# Patient Record
Sex: Male | Born: 1970 | Race: White | Hispanic: No | Marital: Married | Smoking: Former smoker
Health system: Southern US, Community
[De-identification: ages and names within clinical notes are randomized; demographics above are authoritative.]

## PROBLEM LIST (undated history)

## (undated) DIAGNOSIS — N433 Hydrocele, unspecified: Secondary | ICD-10-CM

## (undated) DIAGNOSIS — E785 Hyperlipidemia, unspecified: Secondary | ICD-10-CM

## (undated) DIAGNOSIS — I10 Essential (primary) hypertension: Secondary | ICD-10-CM

## (undated) HISTORY — PX: APPENDECTOMY: SHX54

## (undated) HISTORY — DX: Hyperlipidemia, unspecified: E78.5

---

## 2016-01-26 ENCOUNTER — Emergency Department (HOSPITAL_BASED_OUTPATIENT_CLINIC_OR_DEPARTMENT_OTHER)
Admission: EM | Admit: 2016-01-26 | Discharge: 2016-01-26 | Disposition: A | Discharge status: Home / Self Care | Payer: Self-pay | Attending: Emergency Medicine | Admitting: Emergency Medicine

## 2016-01-26 ENCOUNTER — Emergency Department (HOSPITAL_BASED_OUTPATIENT_CLINIC_OR_DEPARTMENT_OTHER): Payer: Self-pay

## 2016-01-26 ENCOUNTER — Encounter (HOSPITAL_BASED_OUTPATIENT_CLINIC_OR_DEPARTMENT_OTHER): Payer: Self-pay | Admitting: *Deleted

## 2016-01-26 DIAGNOSIS — M25512 Pain in left shoulder: Secondary | ICD-10-CM | POA: Insufficient documentation

## 2016-01-26 DIAGNOSIS — R030 Elevated blood-pressure reading, without diagnosis of hypertension: Secondary | ICD-10-CM

## 2016-01-26 DIAGNOSIS — I10 Essential (primary) hypertension: Secondary | ICD-10-CM | POA: Insufficient documentation

## 2016-01-26 DIAGNOSIS — Z880 Allergy status to penicillin: Secondary | ICD-10-CM | POA: Insufficient documentation

## 2016-01-26 DIAGNOSIS — IMO0001 Reserved for inherently not codable concepts without codable children: Secondary | ICD-10-CM

## 2016-01-26 MED ORDER — MELOXICAM 7.5 MG PO TABS: 7.5000 mg | ORAL_TABLET | Freq: Every day | ORAL | Status: DC

## 2016-01-26 NOTE — ED Provider Notes (Signed)
CSN: [id number]     Arrival date & time 01/26/16  1113 History   First MD Initiated Contact with Patient 01/26/16 1123     Chief Complaint  Patient presents with  . Shoulder Pain     (Consider location/radiation/quality/duration/timing/severity/associated sxs/prior Treatment) Patient is a 45 y.o. male presenting with shoulder pain. The history is provided by the patient and medical records. No language interpreter was used.  Shoulder Pain Associated symptoms: no fever and no neck pain    Christian Simon is a 45 y.o. male  with no pertinent PMH who presents to the Emergency Department complaining of constant, persistent left shoulder pain 1 week. 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 He denies any injury or trauma, however states the repetitive motion of his job could be source of pain. No medications taken prior to arrival for symptoms. Pain is worse with palpation and movement. No alleviating factors noted.  History reviewed. No pertinent past medical history. Past Surgical History  Procedure Laterality Date  . Appendectomy     History reviewed. No pertinent family history. Social History  Substance Use Topics  . Smoking status: Never Smoker   . Smokeless tobacco: None  . Alcohol Use: No    Review of Systems  Constitutional: Negative for fever.  HENT: Negative for congestion.   Eyes: Negative for visual disturbance.  Respiratory: Negative for shortness of breath.   Cardiovascular: Negative for chest pain.  Gastrointestinal: Negative for abdominal pain.  Genitourinary: Negative for dysuria.  Musculoskeletal: Positive for arthralgias. Negative for neck pain.  Skin: Negative for color change.  Neurological: Negative for dizziness and headaches.      Allergies  Penicillins  Home Medications   Prior to Admission medications   Medication Sig Start Date End Date Taking? Authorizing Provider  meloxicam (MOBIC) 7.5 MG tablet Take 1 tablet (7.5 mg  total) by mouth daily. 01/26/16   Christian Molzahn Pilcher Bubber Rothert, PA-C   BP 128/83 mmHg  Pulse 82  Temp(Src) 98.9 F (37.2 C) (Oral)  Resp 16  Ht 6' (1.829 m)  Wt 120.203 kg  BMI 35.93 kg/m2  SpO2 98% Physical Exam  Constitutional: He is oriented to person, place, and time. He appears well-developed and well-nourished.  Alert and in no acute distress  HENT:  Head: Normocephalic and atraumatic.  Neck:  No midline or paraspinal tenderness to palpation. Full range of motion without pain.  Cardiovascular: Normal rate, regular rhythm and normal heart sounds.  Exam reveals no gallop and no friction rub.   No murmur heard. Pulmonary/Chest: Effort normal and breath sounds normal. No respiratory distress. He has no wheezes. He has no rales. He exhibits no tenderness.  Abdominal: Soft. He exhibits no distension. There is no tenderness.  Musculoskeletal:       Arms: Left shoulder with decreased range of motion secondary to pain. Tenderness as depicted in image. No overlying erythema, edema, ecchymosis, or deformity appreciated. Positive Neer's. 2+ radial pulse and sensation intact in median, radial, and ulnar distribution. 5 of 5 muscle strength of bilateral upper extremities.  Neurological: He is alert and oriented to person, place, and time.  Skin: Skin is warm and dry.  Nursing note and vitals reviewed.   ED Course  Procedures (including critical care time) Labs Review Labs Reviewed - No data to display  Imaging Review Dg Shoulder Left  01/26/2016  CLINICAL DATA:  Left shoulder pain for 3 weeks.  No reported injury. EXAM: LEFT SHOULDER - 2+ VIEW COMPARISON:  None. FINDINGS: No fracture, dislocation, Hill-Sachs deformity or suspicious focal osseous lesion. Mild osteoarthritis in the left acromioclavicular joint. Left glenohumeral joint appears normal. No pathologic soft tissue calcifications. IMPRESSION: Mild left acromioclavicular joint osteoarthritis. Otherwise unremarkable left shoulder  radiographs. Electronically Signed   By: Delbert Phenix M.D.   On: 01/26/2016 11:44   I have personally reviewed and evaluated these images and lab results as part of my medical decision-making.   EKG Interpretation None      MDM   Final diagnoses:  Shoulder pain, left  Elevated blood pressure   Christian Simon presents with persistent left shoulder pain x 1 week. Patient works as a Psychologist, occupational, mostly overhead jobs. Repetitive nature of overhead shoulder motions during job likely contributory. X-ray was obtained which showed mild AC joint osteoarthritis otherwise unremarkable. Patient is not tender over the [facility] joint, likely incidental finding. Will treat with NSAIDS, ice, rest - symptomatic home care instructions given. Patient to follow up with orthopedic in 1 week if no improvement in symptoms, earlier if he would like. Return precautions discussed and all questions answered.   [facility] Christian Greulich, PA-C 01/26/16 1258  Christian Mulders, MD 01/26/16 [phone]

## 2016-01-26 NOTE — ED Notes (Signed)
Left shoulder pain x several days.  Tender on palpation, movement.  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

## 2016-01-26 NOTE — Discharge Instructions (Signed)
Take anti-inflammatory as directed. Ice affected area (instructions below). If symptoms do not improve with anti-inflammatories, rest, and ice you will need to see the orthopedic listed for further evaluation. Return to ER for new or worsening symptoms, any additional concerns. Your blood pressure was also elevated today, I would like you to follow up with the primary care physician for a blood pressure check in one week.  COLD THERAPY DIRECTIONS:  Ice or gel packs can be used to reduce both pain and swelling. Ice is the most helpful within the first 24 to 48 hours after an injury or flareup from overusing a muscle or joint.  Ice is effective, has very few side effects, and is safe for most people to use.   If you expose your skin to cold temperatures for too long or without the proper protection, you can damage your skin or nerves. Watch for signs of skin damage due to cold.   HOME CARE INSTRUCTIONS  Follow these tips to use ice and cold packs safely.  Place a dry or damp towel between the ice and skin. A damp towel will cool the skin more quickly, so you may need to shorten the time that the ice is used.  For a more rapid response, add gentle compression to the ice.  Ice for no more than 10 to 20 minutes at a time. The bonier the area you are icing, the less time it will take to get the benefits of ice.  Check your skin after 5 minutes to make sure there are no signs of a poor response to cold or skin damage.  Rest 20 minutes or more in between uses.  Once your skin is numb, you can end your treatment. You can test numbness by very lightly touching your skin. The touch should be so light that you do not see the skin dimple from the pressure of your fingertip. When using ice, most people will feel these normal sensations in this order: cold, burning, aching, and numbness.

## 2016-11-25 DIAGNOSIS — I10 Essential (primary) hypertension: Secondary | ICD-10-CM | POA: Diagnosis not present

## 2016-11-25 DIAGNOSIS — Z880 Allergy status to penicillin: Secondary | ICD-10-CM | POA: Diagnosis not present

## 2016-11-25 DIAGNOSIS — J0120 Acute ethmoidal sinusitis, unspecified: Secondary | ICD-10-CM | POA: Diagnosis not present

## 2016-11-26 ENCOUNTER — Ambulatory Visit (INDEPENDENT_AMBULATORY_CARE_PROVIDER_SITE_OTHER): Payer: 59

## 2016-11-26 ENCOUNTER — Ambulatory Visit (HOSPITAL_COMMUNITY)
Admission: EM | Admit: 2016-11-26 | Discharge: 2016-11-26 | Disposition: A | Discharge status: Home / Self Care | Payer: 59 | Attending: Family Medicine | Admitting: Family Medicine

## 2016-11-26 ENCOUNTER — Encounter (HOSPITAL_COMMUNITY): Payer: Self-pay | Admitting: Emergency Medicine

## 2016-11-26 DIAGNOSIS — J0100 Acute maxillary sinusitis, unspecified: Secondary | ICD-10-CM

## 2016-11-26 DIAGNOSIS — R05 Cough: Secondary | ICD-10-CM | POA: Diagnosis not present

## 2016-11-26 MED ORDER — GUAIFENESIN-CODEINE 100-10 MG/5ML PO SYRP: 10.0000 mL | ORAL_SOLUTION | Freq: Four times a day (QID) | ORAL | 0 refills | Status: DC | PRN

## 2016-11-26 MED ORDER — IPRATROPIUM BROMIDE 0.06 % NA SOLN: 2.0000 | Freq: Four times a day (QID) | NASAL | 1 refills | Status: DC

## 2016-11-26 MED ORDER — DOXYCYCLINE HYCLATE 100 MG PO CAPS: 100.0000 mg | ORAL_CAPSULE | Freq: Two times a day (BID) | ORAL | 0 refills | Status: DC

## 2016-11-26 NOTE — Discharge Instructions (Signed)
Drink plenty of fluids as discussed, use medicine as prescribed, and mucinex or delsym for cough. Return or see your doctor if further problems °

## 2016-11-26 NOTE — ED Provider Notes (Signed)
[facility]    CSN: [id number] Arrival date & time: 11/26/16  4098     History   Chief Complaint Chief Complaint  Patient presents with  . URI    HPI Christian Simon is a 46 y.o. male.   The history is provided by the patient.  URI  Presenting symptoms: congestion, cough and rhinorrhea   Presenting symptoms: no fever   Severity:  Mild Onset quality:  Gradual Duration:  1 month Progression:  Worsening Chronicity:  New Relieved by:  None tried Worsened by:  Nothing Ineffective treatments:  None tried Associated symptoms: myalgias   Associated symptoms: no wheezing     History reviewed. No pertinent past medical history.  There are no active problems to display for this patient.   Past Surgical History:  Procedure Laterality Date  . APPENDECTOMY         Home Medications    Prior to Admission medications   Medication Sig Start Date End Date Taking? Authorizing Provider  doxycycline (VIBRAMYCIN) 100 MG capsule Take 1 capsule (100 mg total) by mouth 2 (two) times daily. 11/26/16   Linna Hoff, MD  guaiFENesin-codeine [facility]) 100-10 MG/5ML syrup Take 10 mLs by mouth 4 (four) times daily as needed for cough. 11/26/16   Linna Hoff, MD  ipratropium (ATROVENT) 0.06 % nasal spray Place 2 sprays into both nostrils 4 (four) times daily. 11/26/16   Linna Hoff, MD  meloxicam (MOBIC) 7.5 MG tablet Take 1 tablet (7.5 mg total) by mouth daily. 01/26/16   Chase Picket Ward, PA-C    Family History History reviewed. No pertinent family history.  Social History Social History  Substance Use Topics  . Smoking status: Never Smoker  . Smokeless tobacco: Never Used  . Alcohol use No     Allergies   Penicillins   Review of Systems Review of Systems  Constitutional: Negative for fever.  HENT: Positive for congestion, postnasal drip and rhinorrhea.   Respiratory: Positive for cough. Negative for wheezing.   Cardiovascular: Negative.     Gastrointestinal: Negative.   Musculoskeletal: Positive for myalgias.  All other systems reviewed and are negative.    Physical Exam Triage Vital Signs ED Triage Vitals [11/26/16 1014]  Enc Vitals Group     BP 139/89     Pulse Rate 89     Resp 16     Temp 98.3 F (36.8 C)     Temp Source Oral     SpO2 96 %     Weight      Height      Head Circumference      Peak Flow      Pain Score      Pain Loc      Pain Edu?      Excl. in GC?    No data found.   Updated Vital Signs BP 139/89 (BP Location: Right Arm)   Pulse 89   Temp 98.3 F (36.8 C) (Oral)   Resp 16   SpO2 96%   Visual Acuity Right Eye Distance:   Left Eye Distance:   Bilateral Distance:    Right Eye Near:   Left Eye Near:    Bilateral Near:     Physical Exam  Constitutional: He is oriented to person, place, and time. He appears well-developed and well-nourished.  HENT:  Right Ear: External ear normal.  Left Ear: External ear normal.  Nose: Nose normal.  Mouth/Throat: Oropharynx is clear and moist.  Eyes:  Pupils are equal, round, and reactive to light.  Neck: Normal range of motion.  Cardiovascular: Normal rate, regular rhythm, normal heart sounds and intact distal pulses.   Pulmonary/Chest: Effort normal and breath sounds normal.  Neurological: He is alert and oriented to person, place, and time.  Skin: Skin is warm and dry.  Nursing note and vitals reviewed.    UC Treatments / Results  Labs (all labs ordered are listed, but only abnormal results are displayed) Labs Reviewed - No data to display  EKG  EKG Interpretation None       Radiology No results found. X-rays reviewed and report per radiologist.  Procedures Procedures (including critical care time)  Medications Ordered in UC Medications - No data to display   Initial Impression / Assessment and Plan / UC Course  I have reviewed the triage vital signs and the nursing notes.  Pertinent labs & imaging results that were  available during my care of the patient were reviewed by me and considered in my medical decision making (see chart for details).      Final Clinical Impressions(s) / UC Diagnoses   Final diagnoses:  Acute non-recurrent maxillary sinusitis    New Prescriptions Discharge Medication List as of 11/26/2016 11:02 AM    START taking these medications   Details  doxycycline (VIBRAMYCIN) 100 MG capsule Take 1 capsule (100 mg total) by mouth 2 (two) times daily., Starting Tue 11/26/2016, Normal    guaiFENesin-codeine (ROBITUSSIN AC) 100-10 MG/5ML syrup Take 10 mLs by mouth 4 (four) times daily as needed for cough., Starting Tue 11/26/2016, Print    ipratropium (ATROVENT) 0.06 % nasal spray Place 2 sprays into both nostrils 4 (four) times daily., Starting Tue 11/26/2016, Normal         Linna HoffJames D Kindl, MD 11/28/16 1450

## 2016-11-26 NOTE — ED Triage Notes (Signed)
Pt c/o cold sx onset: 1 month ++   Sx include: ST, dry cough, nasal drainage/congestion, rib pain due to cough, fevers, BA  Taking: OTC cold meds w/no relief.   A&O x4... NAD

## 2016-12-20 DIAGNOSIS — Z0001 Encounter for general adult medical examination with abnormal findings: Secondary | ICD-10-CM | POA: Diagnosis not present

## 2016-12-20 DIAGNOSIS — R740 Nonspecific elevation of levels of transaminase and lactic acid dehydrogenase [LDH]: Secondary | ICD-10-CM | POA: Diagnosis not present

## 2016-12-20 DIAGNOSIS — Z13220 Encounter for screening for lipoid disorders: Secondary | ICD-10-CM | POA: Diagnosis not present

## 2016-12-20 DIAGNOSIS — Z23 Encounter for immunization: Secondary | ICD-10-CM | POA: Diagnosis not present

## 2017-01-13 DIAGNOSIS — H6123 Impacted cerumen, bilateral: Secondary | ICD-10-CM | POA: Diagnosis not present

## 2017-01-13 DIAGNOSIS — I10 Essential (primary) hypertension: Secondary | ICD-10-CM | POA: Diagnosis not present

## 2017-01-13 DIAGNOSIS — R740 Nonspecific elevation of levels of transaminase and lactic acid dehydrogenase [LDH]: Secondary | ICD-10-CM | POA: Diagnosis not present

## 2017-01-13 DIAGNOSIS — N433 Hydrocele, unspecified: Secondary | ICD-10-CM | POA: Diagnosis not present

## 2017-04-28 DIAGNOSIS — I10 Essential (primary) hypertension: Secondary | ICD-10-CM | POA: Diagnosis not present

## 2017-04-28 DIAGNOSIS — R740 Nonspecific elevation of levels of transaminase and lactic acid dehydrogenase [LDH]: Secondary | ICD-10-CM | POA: Diagnosis not present

## 2017-04-28 DIAGNOSIS — K219 Gastro-esophageal reflux disease without esophagitis: Secondary | ICD-10-CM | POA: Diagnosis not present

## 2017-05-06 ENCOUNTER — Other Ambulatory Visit: Payer: Self-pay | Admitting: Family Medicine

## 2017-05-06 DIAGNOSIS — R7401 Elevation of levels of liver transaminase levels: Secondary | ICD-10-CM

## 2017-05-06 DIAGNOSIS — R740 Nonspecific elevation of levels of transaminase and lactic acid dehydrogenase [LDH]: Principal | ICD-10-CM

## 2017-06-09 DIAGNOSIS — N430 Encysted hydrocele: Secondary | ICD-10-CM | POA: Diagnosis not present

## 2017-06-12 ENCOUNTER — Other Ambulatory Visit: Payer: Self-pay | Admitting: Urology

## 2017-06-13 ENCOUNTER — Encounter (HOSPITAL_BASED_OUTPATIENT_CLINIC_OR_DEPARTMENT_OTHER): Payer: Self-pay | Admitting: *Deleted

## 2017-06-13 NOTE — Progress Notes (Signed)
NPO AFTER MN W/ EXCEPTION CLEAR  LIQUIDS UNTIL 0630 (NO CREAM/MILK PRODUCTS).  ARRIVE AT 1100.  NEEDS ISTAT AND EKG.

## 2017-06-20 ENCOUNTER — Ambulatory Visit (HOSPITAL_BASED_OUTPATIENT_CLINIC_OR_DEPARTMENT_OTHER): Payer: 59 | Admitting: Anesthesiology

## 2017-06-20 ENCOUNTER — Encounter (HOSPITAL_BASED_OUTPATIENT_CLINIC_OR_DEPARTMENT_OTHER)
Admission: RE | Disposition: A | Discharge status: Home / Self Care | Payer: Self-pay | Source: Ambulatory Visit | Attending: Urology

## 2017-06-20 ENCOUNTER — Ambulatory Visit (HOSPITAL_BASED_OUTPATIENT_CLINIC_OR_DEPARTMENT_OTHER)
Admission: RE | Admit: 2017-06-20 | Discharge: 2017-06-20 | Disposition: A | Discharge status: Home / Self Care | Payer: 59 | Source: Ambulatory Visit | Attending: Urology | Admitting: Urology

## 2017-06-20 ENCOUNTER — Encounter (HOSPITAL_BASED_OUTPATIENT_CLINIC_OR_DEPARTMENT_OTHER): Payer: Self-pay | Admitting: *Deleted

## 2017-06-20 DIAGNOSIS — Z79899 Other long term (current) drug therapy: Secondary | ICD-10-CM | POA: Insufficient documentation

## 2017-06-20 DIAGNOSIS — I10 Essential (primary) hypertension: Secondary | ICD-10-CM | POA: Diagnosis not present

## 2017-06-20 DIAGNOSIS — N433 Hydrocele, unspecified: Secondary | ICD-10-CM | POA: Insufficient documentation

## 2017-06-20 DIAGNOSIS — Z87891 Personal history of nicotine dependence: Secondary | ICD-10-CM | POA: Insufficient documentation

## 2017-06-20 HISTORY — PX: HYDROCELE EXCISION: SHX482

## 2017-06-20 HISTORY — DX: Essential (primary) hypertension: I10

## 2017-06-20 HISTORY — DX: Hydrocele, unspecified: N43.3

## 2017-06-20 LAB — POCT I-STAT 4, (NA,K, GLUC, HGB,HCT)
Glucose, Bld: 93 mg/dL (ref 65–99)
HEMATOCRIT: 41 % (ref 39.0–52.0)
HEMOGLOBIN: 13.9 g/dL (ref 13.0–17.0)
Potassium: 3.9 mmol/L (ref 3.5–5.1)
SODIUM: 140 mmol/L (ref 135–145)

## 2017-06-20 SURGERY — HYDROCELECTOMY
Anesthesia: General | Site: Scrotum | Laterality: Right

## 2017-06-20 MED ORDER — PROPOFOL 10 MG/ML IV BOLUS
INTRAVENOUS | Status: AC
  Filled 2017-06-20: qty 20

## 2017-06-20 MED ORDER — OXYCODONE HCL 5 MG/5ML PO SOLN
5.0000 mg | Freq: Once | ORAL | Status: DC | PRN
  Filled 2017-06-20: qty 5

## 2017-06-20 MED ORDER — MIDAZOLAM HCL 2 MG/2ML IJ SOLN
INTRAMUSCULAR | Status: AC
  Filled 2017-06-20: qty 2

## 2017-06-20 MED ORDER — HYDROMORPHONE HCL 1 MG/ML IJ SOLN
0.2500 mg | INTRAMUSCULAR | Status: DC | PRN
  Administered 2017-06-20 (×2): 0.25 mg via INTRAVENOUS
  Filled 2017-06-20: qty 0.5

## 2017-06-20 MED ORDER — MIDAZOLAM HCL 2 MG/2ML IJ SOLN
INTRAMUSCULAR | Status: DC | PRN
  Administered 2017-06-20: 2 mg via INTRAVENOUS

## 2017-06-20 MED ORDER — LACTATED RINGERS IV SOLN
INTRAVENOUS | Status: DC
  Administered 2017-06-20: 12:00:00 via INTRAVENOUS
  Filled 2017-06-20: qty 1000

## 2017-06-20 MED ORDER — ONDANSETRON HCL 4 MG/2ML IJ SOLN
INTRAMUSCULAR | Status: AC
  Filled 2017-06-20: qty 2

## 2017-06-20 MED ORDER — DEXAMETHASONE SODIUM PHOSPHATE 10 MG/ML IJ SOLN
INTRAMUSCULAR | Status: AC
  Filled 2017-06-20: qty 1

## 2017-06-20 MED ORDER — FENTANYL CITRATE (PF) 100 MCG/2ML IJ SOLN
INTRAMUSCULAR | Status: AC
  Filled 2017-06-20: qty 2

## 2017-06-20 MED ORDER — LIDOCAINE 2% (20 MG/ML) 5 ML SYRINGE
INTRAMUSCULAR | Status: AC
  Filled 2017-06-20: qty 5

## 2017-06-20 MED ORDER — LIDOCAINE HCL 2 % IJ SOLN
INTRAMUSCULAR | Status: DC | PRN
  Administered 2017-06-20: 10 mL

## 2017-06-20 MED ORDER — FENTANYL CITRATE (PF) 100 MCG/2ML IJ SOLN
INTRAMUSCULAR | Status: DC | PRN
  Administered 2017-06-20 (×3): 25 ug via INTRAVENOUS
  Administered 2017-06-20: 50 ug via INTRAVENOUS
  Administered 2017-06-20: 25 ug via INTRAVENOUS
  Administered 2017-06-20: 50 ug via INTRAVENOUS

## 2017-06-20 MED ORDER — OXYCODONE HCL 5 MG PO TABS
5.0000 mg | ORAL_TABLET | Freq: Once | ORAL | Status: DC | PRN
  Filled 2017-06-20: qty 1

## 2017-06-20 MED ORDER — CLINDAMYCIN PHOSPHATE 900 MG/50ML IV SOLN
INTRAVENOUS | Status: AC
  Filled 2017-06-20: qty 50

## 2017-06-20 MED ORDER — LIDOCAINE 2% (20 MG/ML) 5 ML SYRINGE
INTRAMUSCULAR | Status: DC | PRN
  Administered 2017-06-20: 100 mg via INTRAVENOUS

## 2017-06-20 MED ORDER — CLINDAMYCIN PHOSPHATE 900 MG/50ML IV SOLN
900.0000 mg | INTRAVENOUS | Status: AC
  Administered 2017-06-20: 900 mg via INTRAVENOUS
  Filled 2017-06-20: qty 50

## 2017-06-20 MED ORDER — HYDROCODONE-ACETAMINOPHEN 5-325 MG PO TABS: 1.0000 | ORAL_TABLET | ORAL | 0 refills | Status: AC | PRN

## 2017-06-20 MED ORDER — DEXAMETHASONE SODIUM PHOSPHATE 10 MG/ML IJ SOLN
INTRAMUSCULAR | Status: DC | PRN
  Administered 2017-06-20: 10 mg via INTRAVENOUS

## 2017-06-20 MED ORDER — MEPERIDINE HCL 25 MG/ML IJ SOLN
6.2500 mg | INTRAMUSCULAR | Status: DC | PRN
  Filled 2017-06-20: qty 1

## 2017-06-20 MED ORDER — PROPOFOL 10 MG/ML IV BOLUS
INTRAVENOUS | Status: DC | PRN
  Administered 2017-06-20: 200 mg via INTRAVENOUS
  Administered 2017-06-20: 50 mg via INTRAVENOUS

## 2017-06-20 MED ORDER — PROMETHAZINE HCL 25 MG/ML IJ SOLN
6.2500 mg | INTRAMUSCULAR | Status: DC | PRN
  Filled 2017-06-20: qty 1

## 2017-06-20 MED ORDER — HYDROMORPHONE HCL-NACL 0.5-0.9 MG/ML-% IV SOSY
PREFILLED_SYRINGE | INTRAVENOUS | Status: AC
  Filled 2017-06-20: qty 1

## 2017-06-20 MED ORDER — ONDANSETRON HCL 4 MG/2ML IJ SOLN
INTRAMUSCULAR | Status: DC | PRN
  Administered 2017-06-20: 4 mg via INTRAVENOUS

## 2017-06-20 SURGICAL SUPPLY — 36 items
BLADE CLIPPER SURG (BLADE) ×3 IMPLANT
BLADE SURG 15 STRL LF DISP TIS (BLADE) ×1 IMPLANT
BLADE SURG 15 STRL SS (BLADE) ×2
BNDG GAUZE ELAST 4 BULKY (GAUZE/BANDAGES/DRESSINGS) ×3 IMPLANT
CANISTER SUCTION 1200CC (MISCELLANEOUS) ×3 IMPLANT
CLEANER CAUTERY TIP 5X5 PAD (MISCELLANEOUS) ×1 IMPLANT
COVER BACK TABLE 60X90IN (DRAPES) ×3 IMPLANT
COVER MAYO STAND STRL (DRAPES) ×3 IMPLANT
DERMABOND ADVANCED (GAUZE/BANDAGES/DRESSINGS) ×2
DERMABOND ADVANCED .7 DNX12 (GAUZE/BANDAGES/DRESSINGS) ×1 IMPLANT
DRAPE LAPAROTOMY 100X72 PEDS (DRAPES) ×3 IMPLANT
ELECT REM PT RETURN 9FT ADLT (ELECTROSURGICAL) ×3
ELECTRODE REM PT RTRN 9FT ADLT (ELECTROSURGICAL) ×1 IMPLANT
GOWN STRL REUS W/ TWL LRG LVL3 (GOWN DISPOSABLE) ×1 IMPLANT
GOWN STRL REUS W/ TWL XL LVL3 (GOWN DISPOSABLE) ×1 IMPLANT
GOWN STRL REUS W/TWL LRG LVL3 (GOWN DISPOSABLE) ×2
GOWN STRL REUS W/TWL XL LVL3 (GOWN DISPOSABLE) ×2
KIT RM TURNOVER CYSTO AR (KITS) ×3 IMPLANT
NEEDLE HYPO 22GX1.5 SAFETY (NEEDLE) ×3 IMPLANT
NS IRRIG 500ML POUR BTL (IV SOLUTION) ×3 IMPLANT
PACK BASIN DAY SURGERY FS (CUSTOM PROCEDURE TRAY) ×3 IMPLANT
PAD CLEANER CAUTERY TIP 5X5 (MISCELLANEOUS) ×2
PENCIL BUTTON HOLSTER BLD 10FT (ELECTRODE) ×3 IMPLANT
SUPPORT SCROTAL LG STRP (MISCELLANEOUS) ×2 IMPLANT
SUPPORTER ATHLETIC LG (MISCELLANEOUS) ×1
SUT CHROMIC 2 0 SH (SUTURE) IMPLANT
SUT CHROMIC 3 0 SH 27 (SUTURE) ×6 IMPLANT
SUT CHROMIC 4 0 SH 27 (SUTURE) IMPLANT
SUT MNCRL AB 4-0 PS2 18 (SUTURE) IMPLANT
SUT VIC AB 0 CT1 36 (SUTURE) ×3 IMPLANT
SYR CONTROL 10ML LL (SYRINGE) ×3 IMPLANT
TOWEL OR 17X24 6PK STRL BLUE (TOWEL DISPOSABLE) ×6 IMPLANT
TRAY DSU PREP LF (CUSTOM PROCEDURE TRAY) ×3 IMPLANT
TUBE CONNECTING 12'X1/4 (SUCTIONS) ×1
TUBE CONNECTING 12X1/4 (SUCTIONS) ×2 IMPLANT
YANKAUER SUCT BULB TIP NO VENT (SUCTIONS) ×3 IMPLANT

## 2017-06-20 NOTE — H&P (View-Only) (Signed)
NPO AFTER MN W/ EXCEPTION CLEAR  LIQUIDS UNTIL 0630 (NO CREAM/MILK PRODUCTS).  ARRIVE AT 1100.  NEEDS ISTAT AND EKG.  

## 2017-06-20 NOTE — Transfer of Care (Signed)
Immediate Anesthesia Transfer of Care Note  Patient: Christian Simon  Procedure(s) Performed: Procedure(s): HYDROCELECTOMY ADULT (Right)  Patient Location: PACU  Anesthesia Type:General  Level of Consciousness: awake, alert  and patient cooperative  Airway & Oxygen Therapy: Patient Spontanous Breathing and Patient connected to face mask oxygen  Post-op Assessment: Report given to RN and Post -op Vital signs reviewed and stable  Post vital signs: Reviewed and stable  Last Vitals:  Vitals:   06/20/17 1102 06/20/17 1323  BP: (!) 151/87   Pulse: 86   Resp: 18   Temp: 37.1 C 37.1 C  SpO2: 97%     Last Pain:  Vitals:   06/20/17 1151  TempSrc:   PainSc: 1       Patients Stated Pain Goal: 9 (06/20/17 1151)  Complications: No apparent anesthesia complications

## 2017-06-20 NOTE — Brief Op Note (Signed)
06/20/2017  1:19 PM  PATIENT:  Christian Simon  46 y.o. male  PRE-OPERATIVE DIAGNOSIS:  RIGHT HYDROCELE  POST-OPERATIVE DIAGNOSIS:  RIGHT HYDROCELE  PROCEDURE:  Procedure(s): HYDROCELECTOMY ADULT (Right)  SURGEON:  Surgeon(s) and Role:    * Crista Elliot, MD - Primary  PHYSICIAN ASSISTANT:   ASSISTANTS: none   ANESTHESIA:   general  EBL:  Total I/O In: 600 [I.V.:600] Out: 25 [Blood:25]  BLOOD ADMINISTERED:none  DRAINS: none   LOCAL MEDICATIONS USED:  LIDOCAINE   SPECIMEN:  No Specimen  DISPOSITION OF SPECIMEN:  N/A  COUNTS:  YES  TOURNIQUET:  * No tourniquets in log *  DICTATION: .Dragon Dictation  PLAN OF CARE: Discharge to home after PACU  PATIENT DISPOSITION:  home   Delay start of Pharmacological VTE agent (>24hrs) due to surgical blood loss or risk of bleeding: not applicable

## 2017-06-20 NOTE — Interval H&P Note (Signed)
History and Physical Interval Note:  06/20/2017 12:27 PM  Christian Simon  has presented today for surgery, with the diagnosis of RIGHT HYDROCELE  The various methods of treatment have been discussed with the patient and family. After consideration of risks, benefits and other options for treatment, the patient has consented to  Procedure(s): HYDROCELECTOMY ADULT (Right) as a surgical intervention .  The patient's history has been reviewed, patient examined, no change in status, stable for surgery.  I have reviewed the patient's chart and labs.  Questions were answered to the patient's satisfaction.     Ray Church, III

## 2017-06-20 NOTE — Anesthesia Procedure Notes (Signed)
Procedure Name: LMA Insertion Date/Time: 06/20/2017 12:42 PM Performed by: Delphia Grates Pre-anesthesia Checklist: Patient identified, Emergency Drugs available, Suction available, Patient being monitored and Timeout performed Patient Re-evaluated:Patient Re-evaluated prior to induction Oxygen Delivery Method: Circle system utilized Preoxygenation: Pre-oxygenation with 100% oxygen Induction Type: IV induction LMA: LMA with gastric port inserted LMA Size: 5.0 Number of attempts: 1 Placement Confirmation: positive ETCO2 and breath sounds checked- equal and bilateral Tube secured with: Tape Dental Injury: Teeth and Oropharynx as per pre-operative assessment

## 2017-06-20 NOTE — Anesthesia Postprocedure Evaluation (Signed)
Anesthesia Post Note  Patient: Christian Simon  Procedure(s) Performed: Procedure(s) (LRB): HYDROCELECTOMY ADULT (Right)     Patient location during evaluation: PACU Anesthesia Type: General Level of consciousness: awake and alert Pain management: pain level controlled Vital Signs Assessment: post-procedure vital signs reviewed and stable Respiratory status: spontaneous breathing, nonlabored ventilation, respiratory function stable and patient connected to nasal cannula oxygen Cardiovascular status: blood pressure returned to baseline and stable Postop Assessment: no signs of nausea or vomiting Anesthetic complications: no    Last Vitals:  Vitals:   06/20/17 1510 06/20/17 1518  BP:  135/78  Pulse:  79  Resp:  16  Temp: 37.3 C   SpO2:  94%    Last Pain:  Vitals:   06/20/17 1445  TempSrc:   PainSc: 4                  Laketra Bowdish

## 2017-06-20 NOTE — Anesthesia Preprocedure Evaluation (Signed)
Anesthesia Evaluation  Patient identified by MRN, date of birth, ID band Patient awake    Reviewed: Allergy & Precautions, NPO status , Patient's Chart, lab work & pertinent test results  Airway Mallampati: II  TM Distance: >3 FB Neck ROM: Full    Dental no notable dental hx.    Pulmonary neg pulmonary ROS, former smoker,    Pulmonary exam normal breath sounds clear to auscultation       Cardiovascular hypertension, Pt. on medications negative cardio ROS Normal cardiovascular exam Rhythm:Regular Rate:Normal     Neuro/Psych negative neurological ROS  negative psych ROS   GI/Hepatic negative GI ROS, Neg liver ROS,   Endo/Other  negative endocrine ROS  Renal/GU negative Renal ROS  negative genitourinary   Musculoskeletal negative musculoskeletal ROS (+)   Abdominal   Peds negative pediatric ROS (+)  Hematology negative hematology ROS (+)   Anesthesia Other Findings   Reproductive/Obstetrics negative OB ROS                             Anesthesia Physical Anesthesia Plan  ASA: II  Anesthesia Plan: General   Post-op Pain Management:    Induction: Intravenous  PONV Risk Score and Plan: 2 and Ondansetron, Midazolam and Treatment may vary due to age or medical condition  Airway Management Planned: LMA  Additional Equipment:   Intra-op Plan:   Post-operative Plan: Extubation in OR  Informed Consent: I have reviewed the patients History and Physical, chart, labs and discussed the procedure including the risks, benefits and alternatives for the proposed anesthesia with the patient or authorized representative who has indicated his/her understanding and acceptance.   Dental advisory given  Plan Discussed with: CRNA  Anesthesia Plan Comments:         Anesthesia Quick Evaluation

## 2017-06-20 NOTE — Op Note (Signed)
PATIENT:  Christian Simon  46 y.o. male  PRE-OPERATIVE DIAGNOSIS:  RIGHT HYDROCELE  POST-OPERATIVE DIAGNOSIS:  RIGHT HYDROCELE  PROCEDURE:  Procedure(s): HYDROCELECTOMY ADULT (Right)  SURGEON:  Surgeon(s) and Role:    * Crista Elliot, MD - Primary  PHYSICIAN ASSISTANT:   ASSISTANTS: none   ANESTHESIA:   general  EBL:  Total I/O In: 600 [I.V.:600] Out: 25 [Blood:25]  BLOOD ADMINISTERED:none  DRAINS: none   LOCAL MEDICATIONS USED:  LIDOCAINE   SPECIMEN:  No Specimen  DISPOSITION OF SPECIMEN:  N/A  Indications for the operation: Patient is a 46 year old male with a right hydrocele.  He presents for the operation  Description of operation: The patient was identified, consent was obtained, patient was taken to the operating room and placed in the supine position.  He is prepped and draped in standard sterile fashion.  Timeout was performed.  A 4 cm transverse right hemiscrotal incision was made and sharply carried this down through the dartos and delivered the testicle with the surrounding hydrocele sac onto the operative field.  The gubernacular attachments were freed from the testicle with a combination of blunt dissection and electrocautery taking care not to injure the cord itself.  I then pierced the hydrocele sac and drained of fluid.  I extended that incision inferiorly and then everted the hydrocele sac back around the testicle.  Excess hydrocele sac was excised and discarded.  4-0 Vicryl was then used to reapproximate the hydrocele sac edges that were everted around the testicle. Meticulous hemostasis was obtained with Bovie electrocautery.  The testicle was delivered back into the scrotum in proper anatomical position.  We closed the dartos with a running 3-0 chromic, followed by closure of the skin with interrupted 3-0 chromics and Dermabond.  Also instilled 10 cc of 2% lidocaine for anesthetic affect.  The dressing was applied and this concluded the operation.  Patient  tolerated procedure well and was stable postoperatively.  PLAN OF CARE: Discharge to home after PACU  PATIENT DISPOSITION:  home

## 2017-06-20 NOTE — H&P (Signed)
CC: I have swelling in my scrotum.  HPI: Christian Simon is a 46 year-old male patient who is here for scrotal swelling.  He first noticed his hydrocele 5 years ago. His hydrocele is on the right side. He does have pain on the side of his hydrocele. His hydrocele does cause restriction of normal activities.   He has not had injuries to the testicles or scrotum. He has not had scrotal surgery. His hydrocele does bother him enough to consider surgical repair. He has not had a testicular infection.   The patient saw a urologist approximately 5 years ago in [place] for right hydrocele. He states this was confirmed by ultrasound. He was offered repair at the time but he decided against it. He now has significant amount of discomfort on daily basis and would like this repaired.   ALLERGIES: Penicillin - Anaphylaxis   MEDICATIONS: Lisinopril 20 mg tablet    GU PSH: None   NON-GU PSH: Appendectomy Appendectomy (open) - 1978   GU PMH: Hydrocele, Right   NON-GU PMH: Arthritis GERD Hypertension Sleep Apnea   FAMILY HISTORY: Cancer - Father Deceased - Father   SOCIAL HISTORY: Marital Status: Married Preferred Language: English; Ethnicity: Not Hispanic Or Latino; Race: White Current Smoking Status: Patient does not smoke anymore. Has not smoked since 02/26/1999.   Tobacco Use Assessment Completed:  Used Tobacco in last 30 days?  Drinks 2 drinks per week.  Drinks 4+ caffeinated drinks per day. Patient's occupation Restaurant manager, fast food overhead crank system.    Notes: 1 biological, 2 step children   REVIEW OF SYSTEMS:    GU Review Male:   Patient reports frequent urination, get up at night to urinate, and leakage of urine. Patient denies hard to postpone urination, burning/ pain with urination, stream starts and stops, trouble starting your stream, have to strain to urinate , erection problems, and penile pain.  Gastrointestinal (Upper):   Patient reports nausea and indigestion/ heartburn. Patient  denies vomiting.  Gastrointestinal (Lower):   Patient reports diarrhea. Patient denies constipation.  Constitutional:   Patient reports fatigue. Patient denies fever, night sweats, and weight loss.  Skin:   Patient denies skin rash/ lesion and itching.  Eyes:   Patient denies blurred vision and double vision.  Ears/ Nose/ Throat:   Patient reports sinus problems. Patient denies sore throat.  Hematologic/Lymphatic:   Patient denies swollen glands and easy bruising.  Cardiovascular:   Patient reports leg swelling. Patient denies chest pains.  Respiratory:   Patient denies cough and shortness of breath.  Endocrine:   Patient reports excessive thirst.   Musculoskeletal:   Patient reports back pain and joint pain.   Neurological:   Patient reports headaches. Patient denies dizziness.  Psychologic:   Patient denies depression and anxiety.   Notes: Blood in urine   VITAL SIGNS:  AFVSS   GU PHYSICAL EXAMINATION:    Scrotum: No lesions. No edema. No cysts. No warts.  Epididymides: Right not palpable left: No spermatocele, no masses, no cysts, no tenderness, no induration, no enlargement.   Testes: No tenderness, no swelling, no enlargement left testis. Normal location left testis.. No mass, no cyst, no varicocele, no hydrocele left testis. Right hemiscrotal swelling consistent with hydrocele. The right testicle was not palpable.  Urethral Meatus: Normal size. No lesion, no wart, no discharge, no polyp. Normal location.  Penis: Circumcised, no warts, no cracks. No dorsal Peyronie's plaques, no left corporal Peyronie's plaques, no right corporal Peyronie's plaques, no scarring, no warts. No balanitis,  no meatal stenosis.   MULTI-SYSTEM PHYSICAL EXAMINATION:    Constitutional: Well-nourished. No physical deformities. Normally developed. Good grooming.  Neck: Neck symmetrical, not swollen. Normal tracheal position.  Respiratory: No labored breathing, no use of accessory muscles.   Cardiovascular:  Normal temperature, adequate perfusion  Skin: No paleness, no jaundice,   Neurologic / Psychiatric: Oriented to time, oriented to place, oriented to person. No depression, no anxiety, no agitation.  Gastrointestinal: non obese abdomen.   Eyes: Normal conjunctivae. Normal eyelids.  Musculoskeletal: Normal gait and station of head and neck.    ASSESSMENT:      ICD-10 Details  1 GU:   Hydrocele - N43.0    PLAN:              Notes:   The patient would like to proceed with right hydrocelectomy. I extensively discussed the risks of bleeding infection and injury to surrounding structures. I specifically discussed postoperative hematoma and weighs to prevent this including using an ice pack indirectly on the scrotum for the first 1-2 days 15 minutes at a time. Also discussed the importance of good scrotal support and avoiding strenuous activity. Proceed with surgery.

## 2017-06-20 NOTE — Discharge Instructions (Addendum)
°  Post Anesthesia Home Care Instructions  Activity: Get plenty of rest for the remainder of the day. A responsible individual must stay with you for 24 hours following the procedure.  For the next 24 hours, DO NOT: -Drive a car -Advertising copywriter -Drink alcoholic beverages -Take any medication unless instructed by your physician -Make any legal decisions or sign important papers.  Meals: Start with liquid foods such as gelatin or soup. Progress to regular foods as tolerated. Avoid greasy, spicy, heavy foods. If nausea and/or vomiting occur, drink only clear liquids until the nausea and/or vomiting subsides. Call your physician if vomiting continues.  Special Instructions/Symptoms: Your throat may feel dry or sore from the anesthesia or the breathing tube placed in your throat during surgery. If this causes discomfort, gargle with warm salt water. The discomfort should disappear within 24 hours.  If you had a scopolamine patch placed behind your ear for the management of post- operative nausea and/or vomiting:  1. The medication in the patch is effective for 72 hours, after which it should be removed.  Wrap patch in a tissue and discard in the trash. Wash hands thoroughly with soap and water. 2. You may remove the patch earlier than 72 hours if you experience unpleasant side effects which may include dry mouth, dizziness or visual disturbances. 3. Avoid touching the patch. Wash your hands with soap and water after contact with the patch.     Be sure to wear great scrotal support.  Apply ice pack indirectly 15 minutes at a time for the next 2 days  Avoid heavy lifting and strenuous activity

## 2017-06-23 ENCOUNTER — Encounter (HOSPITAL_BASED_OUTPATIENT_CLINIC_OR_DEPARTMENT_OTHER): Payer: Self-pay | Admitting: Urology

## 2017-12-22 ENCOUNTER — Ambulatory Visit
Admission: RE | Admit: 2017-12-22 | Discharge: 2017-12-22 | Disposition: A | Discharge status: Home / Self Care | Payer: 59 | Source: Ambulatory Visit | Attending: Family Medicine | Admitting: Family Medicine

## 2017-12-22 ENCOUNTER — Other Ambulatory Visit: Payer: Self-pay | Admitting: Family Medicine

## 2017-12-22 DIAGNOSIS — M67844 Other specified disorders of tendon, left hand: Secondary | ICD-10-CM

## 2017-12-22 DIAGNOSIS — M79642 Pain in left hand: Secondary | ICD-10-CM | POA: Diagnosis not present

## 2017-12-22 DIAGNOSIS — Z13220 Encounter for screening for lipoid disorders: Secondary | ICD-10-CM | POA: Diagnosis not present

## 2017-12-22 DIAGNOSIS — E669 Obesity, unspecified: Secondary | ICD-10-CM | POA: Diagnosis not present

## 2017-12-22 DIAGNOSIS — R740 Nonspecific elevation of levels of transaminase and lactic acid dehydrogenase [LDH]: Secondary | ICD-10-CM | POA: Diagnosis not present

## 2017-12-22 DIAGNOSIS — I10 Essential (primary) hypertension: Secondary | ICD-10-CM | POA: Diagnosis not present

## 2017-12-22 DIAGNOSIS — R5383 Other fatigue: Secondary | ICD-10-CM | POA: Diagnosis not present

## 2018-01-26 DIAGNOSIS — G4733 Obstructive sleep apnea (adult) (pediatric): Secondary | ICD-10-CM | POA: Diagnosis not present

## 2018-01-30 DIAGNOSIS — I10 Essential (primary) hypertension: Secondary | ICD-10-CM | POA: Diagnosis not present

## 2018-01-30 DIAGNOSIS — R0609 Other forms of dyspnea: Secondary | ICD-10-CM | POA: Diagnosis not present

## 2018-02-02 ENCOUNTER — Other Ambulatory Visit: Payer: Self-pay | Admitting: Family Medicine

## 2018-02-02 ENCOUNTER — Ambulatory Visit
Admission: RE | Admit: 2018-02-02 | Discharge: 2018-02-02 | Disposition: A | Discharge status: Home / Self Care | Payer: 59 | Source: Ambulatory Visit | Attending: Family Medicine | Admitting: Family Medicine

## 2018-02-02 DIAGNOSIS — R0609 Other forms of dyspnea: Principal | ICD-10-CM

## 2018-02-02 DIAGNOSIS — R079 Chest pain, unspecified: Secondary | ICD-10-CM | POA: Diagnosis not present

## 2018-02-17 DIAGNOSIS — R0602 Shortness of breath: Secondary | ICD-10-CM | POA: Diagnosis not present

## 2018-02-17 DIAGNOSIS — R0600 Dyspnea, unspecified: Secondary | ICD-10-CM | POA: Diagnosis not present

## 2018-02-17 DIAGNOSIS — G4733 Obstructive sleep apnea (adult) (pediatric): Secondary | ICD-10-CM | POA: Diagnosis not present

## 2018-04-05 DIAGNOSIS — K210 Gastro-esophageal reflux disease with esophagitis: Secondary | ICD-10-CM | POA: Diagnosis not present

## 2018-04-05 DIAGNOSIS — B369 Superficial mycosis, unspecified: Secondary | ICD-10-CM | POA: Diagnosis not present

## 2018-04-29 DIAGNOSIS — M5440 Lumbago with sciatica, unspecified side: Secondary | ICD-10-CM | POA: Diagnosis not present

## 2018-05-17 DIAGNOSIS — M5440 Lumbago with sciatica, unspecified side: Secondary | ICD-10-CM | POA: Diagnosis not present

## 2018-12-09 DIAGNOSIS — H6123 Impacted cerumen, bilateral: Secondary | ICD-10-CM | POA: Diagnosis not present

## 2018-12-09 DIAGNOSIS — Z0000 Encounter for general adult medical examination without abnormal findings: Secondary | ICD-10-CM | POA: Diagnosis not present

## 2019-02-08 IMAGING — CR DG CHEST 2V
2 series · 2 of 2 positions shown · non-contrast
Comparison: PA and lateral chest x-ray November 26, 2016

CLINICAL DATA: Exertional dyspnea, mid chest pain, duration of
symptoms 2 months. Former smoker.

EXAM:
CHEST - 2 VIEW

[w chest pa]
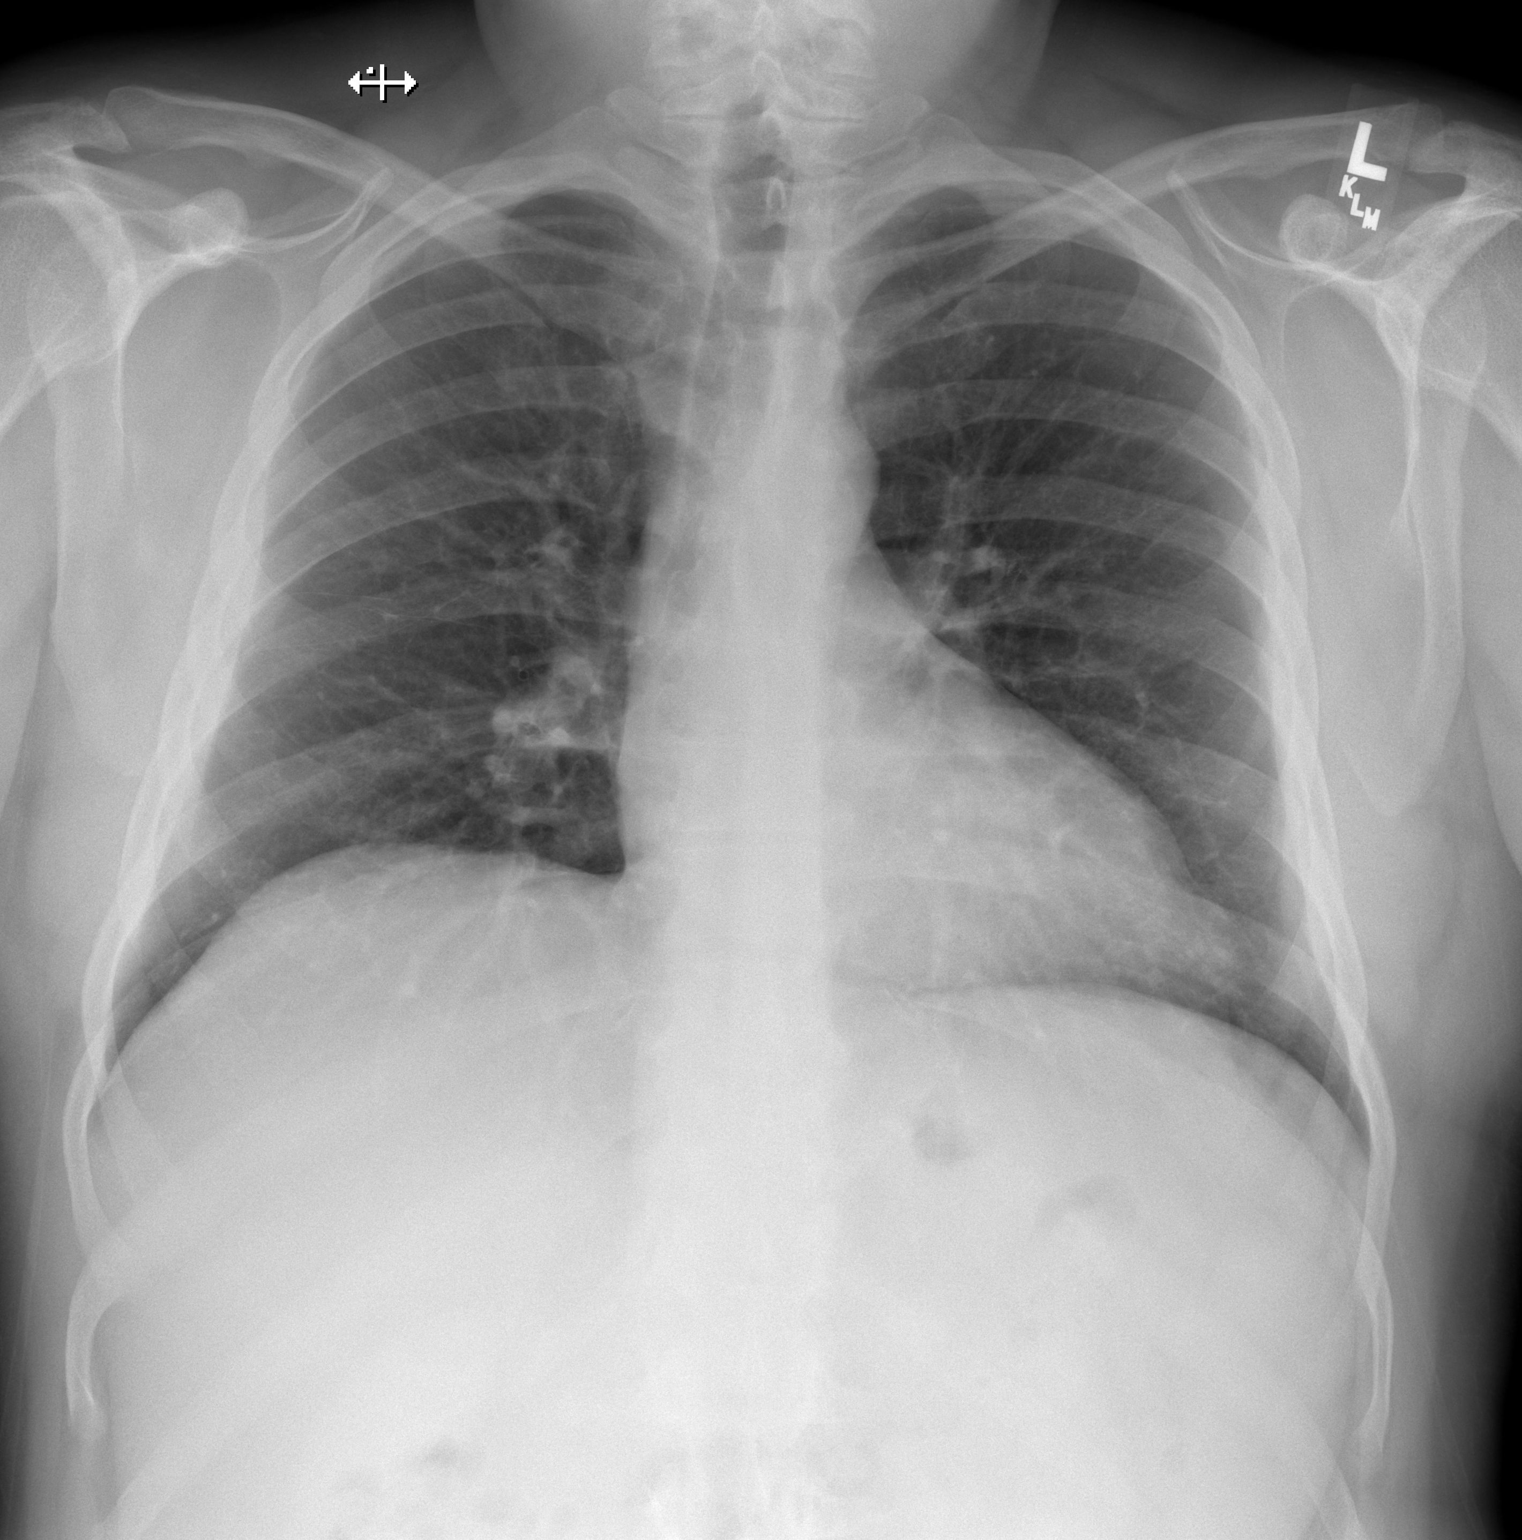

[w chest lat]
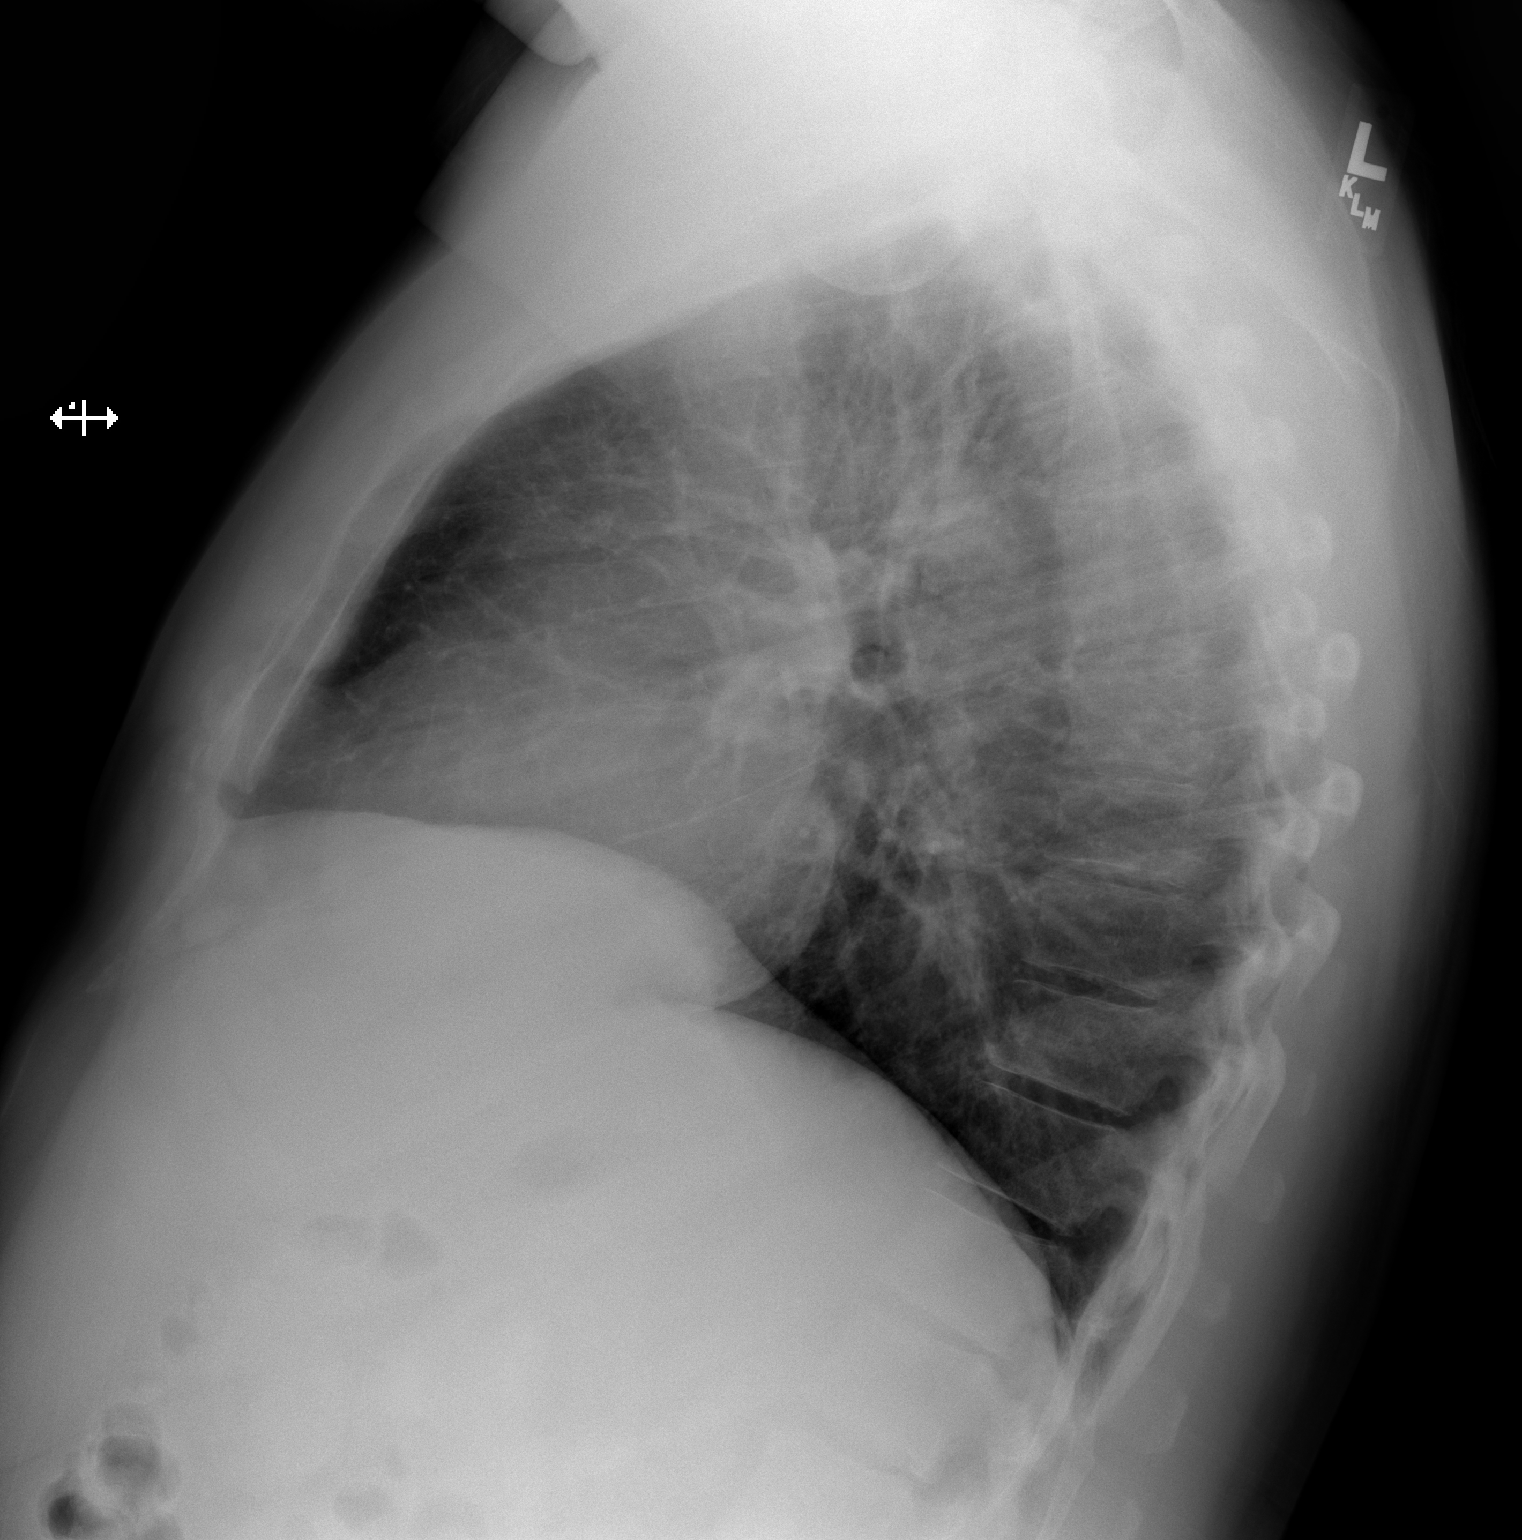

[2 of 2 positions shown; findings below may reference images not displayed]

FINDINGS: The lungs are adequately inflated and clear. The heart and pulmonary
vascularity are normal. The mediastinum is normal in width. The
trachea is midline. There is no pleural effusion. The bony thorax
exhibits no acute abnormality.
IMPRESSION: There is no active cardiopulmonary disease.

## 2019-12-21 ENCOUNTER — Encounter (HOSPITAL_BASED_OUTPATIENT_CLINIC_OR_DEPARTMENT_OTHER): Payer: Self-pay | Admitting: Emergency Medicine

## 2019-12-21 ENCOUNTER — Emergency Department (HOSPITAL_BASED_OUTPATIENT_CLINIC_OR_DEPARTMENT_OTHER)
Admission: EM | Admit: 2019-12-21 | Discharge: 2019-12-21 | Disposition: A | Discharge status: Home / Self Care | Payer: 59 | Attending: Emergency Medicine | Admitting: Emergency Medicine

## 2019-12-21 ENCOUNTER — Other Ambulatory Visit: Payer: Self-pay

## 2019-12-21 DIAGNOSIS — Z880 Allergy status to penicillin: Secondary | ICD-10-CM | POA: Insufficient documentation

## 2019-12-21 DIAGNOSIS — M5441 Lumbago with sciatica, right side: Secondary | ICD-10-CM | POA: Insufficient documentation

## 2019-12-21 DIAGNOSIS — Z79899 Other long term (current) drug therapy: Secondary | ICD-10-CM | POA: Insufficient documentation

## 2019-12-21 DIAGNOSIS — Z87891 Personal history of nicotine dependence: Secondary | ICD-10-CM | POA: Insufficient documentation

## 2019-12-21 DIAGNOSIS — I10 Essential (primary) hypertension: Secondary | ICD-10-CM | POA: Insufficient documentation

## 2019-12-21 MED ORDER — FENTANYL CITRATE (PF) 100 MCG/2ML IJ SOLN
50.0000 ug | Freq: Once | INTRAMUSCULAR | Status: AC
  Administered 2019-12-21: 07:00:00 50 ug via INTRAMUSCULAR
  Filled 2019-12-21: qty 2

## 2019-12-21 MED ORDER — CYCLOBENZAPRINE HCL 10 MG PO TABS: 5.0000 mg | ORAL_TABLET | Freq: Three times a day (TID) | ORAL | 0 refills | Status: AC | PRN

## 2019-12-21 MED ORDER — PREDNISONE 50 MG PO TABS
60.0000 mg | ORAL_TABLET | Freq: Once | ORAL | Status: AC
  Administered 2019-12-21: 07:00:00 60 mg via ORAL
  Filled 2019-12-21: qty 1

## 2019-12-21 MED ORDER — KETOROLAC TROMETHAMINE 60 MG/2ML IM SOLN
60.0000 mg | Freq: Once | INTRAMUSCULAR | Status: AC
  Administered 2019-12-21: 07:00:00 60 mg via INTRAMUSCULAR
  Filled 2019-12-21: qty 2

## 2019-12-21 MED ORDER — LIDO-CAPSAICIN-MEN-METHYL SAL 0.5-0.035-5-20 % EX PTCH: 1.0000 | MEDICATED_PATCH | CUTANEOUS | 0 refills | Status: AC | PRN

## 2019-12-21 MED ORDER — PREDNISONE 20 MG PO TABS: ORAL_TABLET | ORAL | 0 refills | Status: AC

## 2019-12-21 MED ORDER — METHOCARBAMOL 500 MG PO TABS
1000.0000 mg | ORAL_TABLET | Freq: Once | ORAL | Status: AC
  Administered 2019-12-21: 07:00:00 1000 mg via ORAL
  Filled 2019-12-21: qty 2

## 2019-12-21 NOTE — ED Provider Notes (Signed)
[facility] EMERGENCY DEPARTMENT Provider Note   CSN: [id number] Arrival date & time: 12/21/19  0093     History Chief Complaint  Patient presents with  . Back Pain    Christian Simon is a 49 y.o. male.  The history is provided by the patient.  Back Pain Location:  Lumbar spine Quality:  Aching, stabbing and shooting Radiates to:  R thigh Pain severity:  Mild Onset quality:  Gradual Duration:  1 day Timing:  Constant Progression:  Worsening Chronicity:  New Context: not occupational injury   Relieved by:  None tried Worsened by:  Ambulation (rest) Ineffective treatments:  None tried      Past Medical History:  Diagnosis Date  . Hypertension   . Left hydrocele     There are no problems to display for this patient.   Past Surgical History:  Procedure Laterality Date  . APPENDECTOMY  age 62  . HYDROCELE EXCISION Right 06/20/2017   Procedure: HYDROCELECTOMY ADULT;  Surgeon: Crista Elliot, MD;  Location: [facility];  Service: Urology;  Laterality: Right;       History reviewed. No pertinent family history.  Social History   Tobacco Use  . Smoking status: Former Smoker    Years: 2.00    Types: Cigarettes    Quit date: 06/13/2009    Years since quitting: 10.5  . Smokeless tobacco: Former User    Types: Snuff, Dorna Bloom    Quit date: 06/13/2014  Substance Use Topics  . Alcohol use: Yes    Alcohol/week: 12.0 standard drinks    Types: 12 Cans of beer per week    Comment: weekends-- 6pk -- 12pk beer  . Drug use: No    Home Medications Prior to Admission medications   Medication Sig Start Date End Date Taking? Authorizing Provider  cyclobenzaprine (FLEXERIL) 10 MG tablet Take 0.5 tablets (5 mg total) by mouth 3 (three) times daily as needed for muscle spasms. 12/21/19   Antia Rahal, Barbara Cower, MD  HYDROcodone-acetaminophen (NORCO) 5-325 MG tablet Take 1 tablet by mouth every 4 (four) hours as needed for moderate pain. 06/20/17   Crista Elliot, MD  Lido-Capsaicin-Men-Methyl Sal 0.5-0.035-5-20 % PTCH Apply 1 patch topically as needed. 12/21/19   Shayla Heming, Barbara Cower, MD  lisinopril (PRINIVIL,ZESTRIL) 20 MG tablet Take 20 mg by mouth every morning.    [provider]  predniSONE (DELTASONE) 20 MG tablet 3 tabs po daily x 3 days, then 2 tabs x 3 days, then 1.5 tabs x 3 days, then 1 tab x 3 days, then 0.5 tabs x 3 days 12/21/19   Laurens Matheny, Barbara Cower, MD    Allergies    Penicillins  Review of Systems   Review of Systems  Musculoskeletal: Positive for back pain.  All other systems reviewed and are negative.   Physical Exam Updated Vital Signs BP (!) 138/91 (BP Location: Right Arm)   Pulse 81   Temp 98.1 F (36.7 C) (Oral)   Resp 18   Ht 6' (1.829 m)   Wt 117 kg   SpO2 99%   BMI 34.98 kg/m   Physical Exam Vitals and nursing note reviewed.  Constitutional:      Appearance: He is well-developed.  HENT:     Head: Normocephalic and atraumatic.     Mouth/Throat:     Mouth: Mucous membranes are dry.     Pharynx: Oropharynx is clear.  Eyes:     Conjunctiva/sclera: Conjunctivae normal.  Cardiovascular:  Rate and Rhythm: Normal rate.  Pulmonary:     Effort: Pulmonary effort is normal. No respiratory distress.  Abdominal:     General: There is no distension.  Musculoskeletal:        General: Normal range of motion.     Cervical back: Normal range of motion.  Skin:    General: Skin is warm and dry.     Coloration: Skin is not jaundiced or pale.  Neurological:     General: No focal deficit present.     Mental Status: He is alert.     Comments: No altered mental status, able to give full seemingly accurate history.  Face is symmetric, EOM's intact, pupils equal and reactive, vision intact, tongue and uvula midline without deviation. Upper and Lower extremity motor 5/5, intact pain perception in distal extremities, 2+ reflexes in biceps, patella and achilles tendons. Able to perform finger to nose normal with both  hands. Walks with antalgic gait but no assistance needed and has no evident ataxia.       ED Results / Procedures / Treatments   Labs (all labs ordered are listed, but only abnormal results are displayed) Labs Reviewed - No data to display  EKG None  Radiology No results found.  Procedures Procedures (including critical care time)  Medications Ordered in ED Medications  ketorolac (TORADOL) injection 60 mg (60 mg Intramuscular Given 12/21/19 0655)  fentaNYL (SUBLIMAZE) injection 50 mcg (50 mcg Intramuscular Given 12/21/19 0657)  methocarbamol (ROBAXIN) tablet 1,000 mg (1,000 mg Oral Given 12/21/19 0653)  predniSONE (DELTASONE) tablet 60 mg (60 mg Oral Given 12/21/19 5366)    ED Course  I have reviewed the triage vital signs and the nursing notes.  Pertinent labs & imaging results that were available during my care of the patient were reviewed by me and considered in my medical decision making (see chart for details).    MDM Rules/Calculators/A&P  Low back pain. Likely msk but has some radiation to right thigh so will treat for sciatica as well with history of arthritis. Will need mri some point in the future as this apparently is recurring issue but no indication for emergent MRI at this time.   Final Clinical Impression(s) / ED Diagnoses Final diagnoses:  Acute bilateral low back pain with right-sided sciatica    Rx / DC Orders ED Discharge Orders         Ordered    predniSONE (DELTASONE) 20 MG tablet     12/21/19 0639    cyclobenzaprine (FLEXERIL) 10 MG tablet  3 times daily PRN     12/21/19 0639    Lido-Capsaicin-Men-Methyl Sal 0.5-0.035-5-20 % PTCH  As needed     12/21/19 0640           Lailie Smead, Corene Cornea, MD 12/21/19 [phone]

## 2019-12-21 NOTE — ED Triage Notes (Signed)
Patient presents with complaints of lower back pain; states more so on left side radiating down left lower extremity; states this he pulled his back yesterday; then states upon waking this am when he got out of bed was unable to walk due to pain. Denies any numbness or tingling.

## 2023-03-04 ENCOUNTER — Encounter: Payer: Self-pay | Admitting: Family Medicine

## 2023-03-04 ENCOUNTER — Ambulatory Visit (INDEPENDENT_AMBULATORY_CARE_PROVIDER_SITE_OTHER): Payer: BC Managed Care – PPO | Admitting: Family Medicine

## 2023-03-04 VITALS — BP 162/98 | HR 80 | Ht 71.0 in | Wt 278.0 lb

## 2023-03-04 DIAGNOSIS — Z1159 Encounter for screening for other viral diseases: Secondary | ICD-10-CM | POA: Diagnosis not present

## 2023-03-04 DIAGNOSIS — Z1211 Encounter for screening for malignant neoplasm of colon: Secondary | ICD-10-CM

## 2023-03-04 DIAGNOSIS — I10 Essential (primary) hypertension: Secondary | ICD-10-CM

## 2023-03-04 DIAGNOSIS — Z23 Encounter for immunization: Secondary | ICD-10-CM

## 2023-03-04 DIAGNOSIS — E559 Vitamin D deficiency, unspecified: Secondary | ICD-10-CM | POA: Diagnosis not present

## 2023-03-04 DIAGNOSIS — Z125 Encounter for screening for malignant neoplasm of prostate: Secondary | ICD-10-CM

## 2023-03-04 DIAGNOSIS — Z131 Encounter for screening for diabetes mellitus: Secondary | ICD-10-CM | POA: Diagnosis not present

## 2023-03-04 DIAGNOSIS — Z114 Encounter for screening for human immunodeficiency virus [HIV]: Secondary | ICD-10-CM | POA: Diagnosis not present

## 2023-03-04 DIAGNOSIS — Z1329 Encounter for screening for other suspected endocrine disorder: Secondary | ICD-10-CM

## 2023-03-04 DIAGNOSIS — Z13220 Encounter for screening for lipoid disorders: Secondary | ICD-10-CM

## 2023-03-04 MED ORDER — TADALAFIL 20 MG PO TABS: 10.0000 mg | ORAL_TABLET | Freq: Every day | ORAL | 0 refills | Status: DC

## 2023-03-04 MED ORDER — LISINOPRIL-HYDROCHLOROTHIAZIDE 20-25 MG PO TABS: 1.0000 | ORAL_TABLET | Freq: Every day | ORAL | 3 refills | Status: DC

## 2023-03-04 MED ORDER — LISINOPRIL-HYDROCHLOROTHIAZIDE 20-25 MG PO TABS: 1.0000 | ORAL_TABLET | Freq: Every day | ORAL | 1 refills | Status: DC

## 2023-03-04 NOTE — Progress Notes (Signed)
New Patient Office Visit   Subjective   Patient ID: Christian Simon, male    DOB: June 28, 1971  Age: 52 y.o. MRN: [id number]  CC:  Chief Complaint  Patient presents with   Establish Care   Hypertension    HPI Christian Simon 52 year old year, presents to establish care. He  has a past medical history of Hypertension and Left hydrocele.  Patient here for elevated blood pressure. He is not exercising and is adherent to low salt diet.  Blood pressure unknown if well controlled at home. Patient denies cardiac symptoms chest pain, chest pressure/discomfort, dyspnea, and palpitations. Patient reports lower extremity edema.Cardiovascular risk factors: family history of premature cardiovascular disease, hypertension, male gender, and obesity (BMI >= 30 kg/m2).     Outpatient Encounter Medications as of 03/04/2023  Medication Sig   cyclobenzaprine (FLEXERIL) 10 MG tablet Take 0.5 tablets (5 mg total) by mouth 3 (three) times daily as needed for muscle spasms.   [DISCONTINUED] lisinopril (PRINIVIL,ZESTRIL) 20 MG tablet Take 20 mg by mouth every morning.   [DISCONTINUED] lisinopril-hydrochlorothiazide (ZESTORETIC) 20-25 MG tablet Take 1 tablet by mouth daily.   [DISCONTINUED] tadalafil (CIALIS) 20 MG tablet Take 10 mg by mouth daily.   HYDROcodone-acetaminophen (NORCO) 5-325 MG tablet Take 1 tablet by mouth every 4 (four) hours as needed for moderate pain. (Patient not taking: Reported on 03/04/2023)   Lido-Capsaicin-Men-Methyl Sal 0.5-0.035-5-20 % PTCH Apply 1 patch topically as needed. (Patient not taking: Reported on 03/04/2023)   lisinopril-hydrochlorothiazide (ZESTORETIC) 20-25 MG tablet Take 1 tablet by mouth daily.   predniSONE (DELTASONE) 20 MG tablet 3 tabs po daily x 3 days, then 2 tabs x 3 days, then 1.5 tabs x 3 days, then 1 tab x 3 days, then 0.5 tabs x 3 days (Patient not taking: Reported on 03/04/2023)   tadalafil (CIALIS) 20 MG tablet Take 0.5 tablets (10 mg total) by mouth daily.   No  facility-administered encounter medications on file as of 03/04/2023.    Past Surgical History:  Procedure Laterality Date   APPENDECTOMY  age 11   HYDROCELE EXCISION Right 06/20/2017   Procedure: HYDROCELECTOMY ADULT;  Surgeon: Crista Elliot, MD;  Location: [facility];  Service: Urology;  Laterality: Right;    Review of Systems  Constitutional:  Negative for chills and fever.  Respiratory:  Negative for shortness of breath.   Cardiovascular:  Negative for chest pain.  Gastrointestinal:  Negative for abdominal pain and vomiting.  Genitourinary:  Negative for dysuria.  Neurological:  Negative for dizziness and headaches.      Objective    BP (!) 162/98   Pulse 80   Ht 5\' 11"  (1.803 m)   Wt 278 lb (126.1 kg)   SpO2 96%   BMI 38.77 kg/m   Physical Exam Vitals reviewed.  Constitutional:      General: He is not in acute distress.    Appearance: Normal appearance. He is not ill-appearing, toxic-appearing or diaphoretic.  HENT:     Head: Normocephalic.  Eyes:     General:        Right eye: No discharge.        Left eye: No discharge.     Conjunctiva/sclera: Conjunctivae normal.  Cardiovascular:     Rate and Rhythm: Normal rate.     Pulses: Normal pulses.     Heart sounds: Normal heart sounds.  Pulmonary:     Effort: Pulmonary effort is normal. No respiratory distress.     Breath  sounds: Normal breath sounds.  Abdominal:     General: Bowel sounds are normal.     Palpations: Abdomen is soft.     Tenderness: There is no abdominal tenderness. There is no right CVA tenderness, left CVA tenderness or guarding.  Musculoskeletal:        General: Normal range of motion.     Cervical back: Normal range of motion.  Skin:    General: Skin is warm and dry.     Capillary Refill: Capillary refill takes less than 2 seconds.  Neurological:     General: No focal deficit present.     Mental Status: He is alert and oriented to person, place, and time.      Coordination: Coordination normal.     Gait: Gait normal.  Psychiatric:        Mood and Affect: Mood normal.        Behavior: Behavior normal.       Assessment & Plan:  Screening for colon cancer -     Cologuard  Vitamin D deficiency -     VITAMIN D 25 Hydroxy (Vit-D Deficiency, Fractures)  Need for hepatitis C screening test -     Hepatitis C antibody  Screening for HIV (human immunodeficiency virus) -     HIV Antibody (routine testing w rflx)  Primary hypertension Assessment & Plan: Vitals:   03/04/23 0908 03/04/23 0915  BP: (!) 160/94 (!) 162/98   Patient reported stop taking lisinopril 20 mg and would like to start again.  Started patient Lisinopril-HCTZ 20-25 mg daily  Explained non pharmacological interventions such as low salt, DASH diet discussed. Educated on stress reduction and physical activity minimum 150 minutes per week. Discussed signs and symptoms of major cardiovascular event and need to present to the ED. Follow up in 4 weeks or sooner if needed. Patient verbalizes understanding regarding plan of care and all questions answered.   Orders: -     CBC with Differential/Platelet -     CMP14+EGFR -     Microalbumin / creatinine urine ratio -     Lisinopril-hydroCHLOROthiazide; Take 1 tablet by mouth daily.  Dispense: 30 tablet; Refill: 1  Screening for diabetes mellitus -     Hemoglobin A1c  Screening for lipid disorders -     Lipid panel  Screening for thyroid disorder -     TSH + free T4  Screening for prostate cancer -     PSA  Other orders -     Tdap vaccine greater than or equal to 7yo IM -     Tadalafil; Take 0.5 tablets (10 mg total) by mouth daily.  Dispense: 10 tablet; Refill: 0    Return in about 4 weeks (around 04/01/2023) for hypertension, re-check blood pressure, migraine medication managment .   Cruzita Lederer Newman Nip, FNP

## 2023-03-04 NOTE — Patient Instructions (Signed)

## 2023-03-04 NOTE — Assessment & Plan Note (Signed)
Vitals:   03/04/23 0908 03/04/23 0915  BP: (!) 160/94 (!) 162/98   Patient reported stop taking lisinopril 20 mg and would like to start again.  Started patient Lisinopril-HCTZ 20-25 mg daily  Explained non pharmacological interventions such as low salt, DASH diet discussed. Educated on stress reduction and physical activity minimum 150 minutes per week. Discussed signs and symptoms of major cardiovascular event and need to present to the ED. Follow up in 4 weeks or sooner if needed. Patient verbalizes understanding regarding plan of care and all questions answered.

## 2023-03-05 ENCOUNTER — Other Ambulatory Visit: Payer: Self-pay | Admitting: Family Medicine

## 2023-03-05 LAB — CBC WITH DIFFERENTIAL/PLATELET
Basophils Absolute: 0.1 10*3/uL (ref 0.0–0.2)
Basos: 1 %
EOS (ABSOLUTE): 0.1 10*3/uL (ref 0.0–0.4)
Hemoglobin: 17.8 g/dL — ABNORMAL HIGH (ref 13.0–17.7)
Immature Grans (Abs): 0.1 10*3/uL (ref 0.0–0.1)
Immature Granulocytes: 2 %
Lymphocytes Absolute: 2.4 10*3/uL (ref 0.7–3.1)
Lymphs: 32 %
MCH: 29.9 pg (ref 26.6–33.0)
MCHC: 34 g/dL (ref 31.5–35.7)
Monocytes Absolute: 0.6 10*3/uL (ref 0.1–0.9)
Neutrophils: 56 %
RDW: 13.2 % (ref 11.6–15.4)

## 2023-03-05 LAB — CMP14+EGFR
Albumin/Globulin Ratio: 1.4 (ref 1.2–2.2)
Alkaline Phosphatase: 90 IU/L (ref 44–121)
BUN: 8 mg/dL (ref 6–24)
Bilirubin Total: 0.4 mg/dL (ref 0.0–1.2)
Chloride: 100 mmol/L (ref 96–106)
Creatinine, Ser: 1.15 mg/dL (ref 0.76–1.27)
Globulin, Total: 3.2 g/dL (ref 1.5–4.5)
Glucose: 81 mg/dL (ref 70–99)
Potassium: 4.9 mmol/L (ref 3.5–5.2)
Total Protein: 7.7 g/dL (ref 6.0–8.5)

## 2023-03-05 LAB — LIPID PANEL
Chol/HDL Ratio: 5.5 ratio — ABNORMAL HIGH (ref 0.0–5.0)
LDL Chol Calc (NIH): 135 mg/dL — ABNORMAL HIGH (ref 0–99)
Triglycerides: 257 mg/dL — ABNORMAL HIGH (ref 0–149)
VLDL Cholesterol Cal: 46 mg/dL — ABNORMAL HIGH (ref 5–40)

## 2023-03-05 LAB — VITAMIN D 25 HYDROXY (VIT D DEFICIENCY, FRACTURES): Vit D, 25-Hydroxy: 26.3 ng/mL — ABNORMAL LOW (ref 30.0–100.0)

## 2023-03-05 LAB — HEMOGLOBIN A1C
Est. average glucose Bld gHb Est-mCnc: 111 mg/dL
Hgb A1c MFr Bld: 5.5 % (ref 4.8–5.6)

## 2023-03-05 LAB — HEPATITIS C ANTIBODY: Hep C Virus Ab: NONREACTIVE

## 2023-03-05 LAB — MICROALBUMIN / CREATININE URINE RATIO

## 2023-03-05 LAB — PSA: Prostate Specific Ag, Serum: 0.4 ng/mL (ref 0.0–4.0)

## 2023-03-05 LAB — HIV ANTIBODY (ROUTINE TESTING W REFLEX): HIV Screen 4th Generation wRfx: NONREACTIVE

## 2023-03-05 MED ORDER — ROSUVASTATIN CALCIUM 20 MG PO TABS: 20.0000 mg | ORAL_TABLET | Freq: Every day | ORAL | 3 refills | Status: DC

## 2023-03-06 LAB — CMP14+EGFR
ALT: 25 IU/L (ref 0–44)
AST: 16 IU/L (ref 0–40)
Albumin: 4.5 g/dL (ref 4.1–5.1)
BUN/Creatinine Ratio: 7 — ABNORMAL LOW (ref 9–20)
CO2: 23 mmol/L (ref 20–29)
Calcium: 9.9 mg/dL (ref 8.7–10.2)
Sodium: 141 mmol/L (ref 134–144)
eGFR: 78 mL/min/{1.73_m2} (ref >59)

## 2023-03-06 LAB — TSH+FREE T4
Free T4: 1.39 ng/dL (ref 0.82–1.77)
TSH: 1.17 u[IU]/mL (ref 0.450–4.500)

## 2023-03-06 LAB — CBC WITH DIFFERENTIAL/PLATELET
Eos: 2 %
Hematocrit: 52.3 % — ABNORMAL HIGH (ref 37.5–51.0)
MCV: 88 fL (ref 79–97)
Monocytes: 7 %
Neutrophils Absolute: 4.3 10*3/uL (ref 1.4–7.0)
Platelets: 233 10*3/uL (ref 150–450)
RBC: 5.95 x10E6/uL — ABNORMAL HIGH (ref 4.14–5.80)
WBC: 7.6 10*3/uL (ref 3.4–10.8)

## 2023-03-06 LAB — LIPID PANEL
Cholesterol, Total: 221 mg/dL — ABNORMAL HIGH (ref 100–199)
HDL: 40 mg/dL (ref >39)

## 2023-04-04 ENCOUNTER — Encounter: Payer: Self-pay | Admitting: Family Medicine

## 2023-04-04 ENCOUNTER — Ambulatory Visit (INDEPENDENT_AMBULATORY_CARE_PROVIDER_SITE_OTHER): Payer: BC Managed Care – PPO | Admitting: Family Medicine

## 2023-04-04 VITALS — BP 150/84 | HR 90 | Ht 71.0 in | Wt 275.0 lb

## 2023-04-04 DIAGNOSIS — I10 Essential (primary) hypertension: Secondary | ICD-10-CM | POA: Diagnosis not present

## 2023-04-04 DIAGNOSIS — Z79890 Hormone replacement therapy (postmenopausal): Secondary | ICD-10-CM | POA: Diagnosis not present

## 2023-04-04 DIAGNOSIS — Z5181 Encounter for therapeutic drug level monitoring: Secondary | ICD-10-CM | POA: Diagnosis not present

## 2023-04-04 MED ORDER — AMLODIPINE BESYLATE 5 MG PO TABS: 5.0000 mg | ORAL_TABLET | Freq: Every day | ORAL | 3 refills | Status: DC

## 2023-04-04 MED ORDER — AMLODIPINE BESYLATE 10 MG PO TABS: 10.0000 mg | ORAL_TABLET | Freq: Every day | ORAL | 3 refills | Status: DC

## 2023-04-04 MED ORDER — LISINOPRIL 40 MG PO TABS: 40.0000 mg | ORAL_TABLET | Freq: Every day | ORAL | 3 refills | Status: DC

## 2023-04-04 MED ORDER — PANTOPRAZOLE SODIUM 40 MG PO TBEC: 40.0000 mg | DELAYED_RELEASE_TABLET | Freq: Every day | ORAL | 3 refills | Status: DC

## 2023-04-04 NOTE — Assessment & Plan Note (Signed)
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 Increased Lisinopril 40 mg daily , started amlodipine 5  mg daily Continued discussion on DASH diet, low sodium diet and maintain a exercise routine for 150 minutes per week.  Follow up in 6 weeks

## 2023-04-04 NOTE — Patient Instructions (Addendum)
        Great to see you today.  I have refilled the medication(s) we provide.    - Please take medications as prescribed. - Follow up with your primary health provider if any health concerns arises. - If symptoms worsen please contact your primary care provider and/or visit the emergency department.  

## 2023-04-04 NOTE — Progress Notes (Signed)
Patient Office Visit   Subjective   Patient ID: Christian Simon, male    DOB: [birth year]  Age: 52 y.o. MRN: [id number]  CC:  Chief Complaint  Patient presents with   Hypertension    Patient is here for HTN f/u. No changes or concerns since last visit.     HPI Christian Simon 52 year old male, presents to the clinic for HTN follow up. He  has a past medical history of Hypertension and Left hydrocele.For the details of today's visit, please refer to assessment and plan.   HPI    Outpatient Encounter Medications as of 04/04/2023  Medication Sig   amLODipine (NORVASC) 5 MG tablet Take 1 tablet (5 mg total) by mouth daily.   anastrozole (ARIMIDEX) 1 MG tablet Take 0.5 mg by mouth daily.   cyclobenzaprine (FLEXERIL) 10 MG tablet Take 0.5 tablets (5 mg total) by mouth 3 (three) times daily as needed for muscle spasms.   HYDROcodone-acetaminophen (NORCO) 5-325 MG tablet Take 1 tablet by mouth every 4 (four) hours as needed for moderate pain.   Lido-Capsaicin-Men-Methyl Sal 0.5-0.035-5-20 % PTCH Apply 1 patch topically as needed.   lisinopril (ZESTRIL) 40 MG tablet Take 1 tablet (40 mg total) by mouth daily.   pantoprazole (PROTONIX) 40 MG tablet Take 1 tablet (40 mg total) by mouth daily.   predniSONE (DELTASONE) 20 MG tablet 3 tabs po daily x 3 days, then 2 tabs x 3 days, then 1.5 tabs x 3 days, then 1 tab x 3 days, then 0.5 tabs x 3 days   rosuvastatin (CRESTOR) 20 MG tablet Take 1 tablet (20 mg total) by mouth daily.   tadalafil (CIALIS) 20 MG tablet Take 0.5 tablets (10 mg total) by mouth daily.   testosterone cypionate (DEPOTESTOSTERONE CYPIONATE) 200 MG/ML injection Inject into the muscle every 14 (fourteen) days.   [DISCONTINUED] amLODipine (NORVASC) 10 MG tablet Take 1 tablet (10 mg total) by mouth daily.   [DISCONTINUED] lisinopril-hydrochlorothiazide (ZESTORETIC) 20-25 MG tablet Take 1 tablet by mouth daily.   No facility-administered encounter medications on file as of 04/04/2023.     Past Surgical History:  Procedure Laterality Date   APPENDECTOMY  age 59   HYDROCELE EXCISION Right 06/20/2017   Procedure: HYDROCELECTOMY ADULT;  Surgeon: Crista Elliot, MD;  Location: [facility];  Service: Urology;  Laterality: Right;    Review of Systems  Constitutional:  Negative for chills and fever.  Eyes:  Negative for blurred vision.  Respiratory:  Negative for shortness of breath.   Cardiovascular:  Negative for chest pain.  Gastrointestinal:  Positive for heartburn.  Genitourinary:  Negative for dysuria.  Musculoskeletal:  Negative for myalgias.  Neurological:  Negative for dizziness and headaches.      Objective    BP (!) 150/84   Pulse 90   Ht 5\' 11"  (1.803 m)   Wt 275 lb (124.7 kg)   SpO2 94%   BMI 38.35 kg/m   Physical Exam Vitals reviewed.  Constitutional:      General: He is not in acute distress.    Appearance: Normal appearance. He is not ill-appearing, toxic-appearing or diaphoretic.  HENT:     Head: Normocephalic.  Eyes:     General:        Right eye: No discharge.        Left eye: No discharge.     Conjunctiva/sclera: Conjunctivae normal.  Cardiovascular:     Rate and Rhythm: Normal rate.  Pulses: Normal pulses.     Heart sounds: Normal heart sounds.  Pulmonary:     Effort: Pulmonary effort is normal. No respiratory distress.     Breath sounds: Normal breath sounds.  Musculoskeletal:        General: Normal range of motion.     Cervical back: Normal range of motion.  Skin:    General: Skin is warm and dry.     Capillary Refill: Capillary refill takes less than 2 seconds.  Neurological:     General: No focal deficit present.     Mental Status: He is alert and oriented to person, place, and time.     Coordination: Coordination normal.     Gait: Gait normal.  Psychiatric:        Mood and Affect: Mood normal.        Behavior: Behavior normal.       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 Increased Lisinopril 40 mg daily , started amlodipine 5  mg daily Continued discussion on DASH diet, low sodium diet and maintain a exercise routine for 150 minutes per week.  Follow up in 6 weeks   Orders: -     Lisinopril; Take 1 tablet (40 mg total) by mouth daily.  Dispense: 30 tablet; Refill: 3  Other orders -     Pantoprazole Sodium; Take 1 tablet (40 mg total) by mouth daily.  Dispense: 90 tablet; Refill: 3 -     amLODIPine Besylate; Take 1 tablet (5 mg total) by mouth daily.  Dispense: 30 tablet; Refill: 3    Return in about 6 weeks (around 05/16/2023) for hypertension, re-check blood pressure.   Cruzita Lederer Newman Nip, FNP

## 2023-05-16 ENCOUNTER — Ambulatory Visit: Payer: BC Managed Care – PPO | Admitting: Family Medicine

## 2023-06-16 ENCOUNTER — Other Ambulatory Visit: Payer: Self-pay | Admitting: Family Medicine

## 2023-06-16 MED ORDER — TADALAFIL 20 MG PO TABS: 10.0000 mg | ORAL_TABLET | Freq: Every day | ORAL | 0 refills | Status: DC

## 2023-07-11 ENCOUNTER — Encounter: Payer: Self-pay | Admitting: Family Medicine

## 2023-08-01 ENCOUNTER — Encounter: Payer: Self-pay | Admitting: Family Medicine

## 2023-08-01 ENCOUNTER — Ambulatory Visit: Payer: BC Managed Care – PPO | Admitting: Family Medicine

## 2023-08-01 VITALS — BP 132/78 | HR 94 | Ht 72.0 in | Wt 278.0 lb

## 2023-08-01 DIAGNOSIS — E669 Obesity, unspecified: Secondary | ICD-10-CM

## 2023-08-01 DIAGNOSIS — B028 Zoster with other complications: Secondary | ICD-10-CM

## 2023-08-01 DIAGNOSIS — E782 Mixed hyperlipidemia: Secondary | ICD-10-CM | POA: Diagnosis not present

## 2023-08-01 MED ORDER — HYDROXYZINE PAMOATE 25 MG PO CAPS: 25.0000 mg | ORAL_CAPSULE | Freq: Three times a day (TID) | ORAL | 2 refills | Status: AC | PRN

## 2023-08-01 MED ORDER — TRIAMCINOLONE ACETONIDE 0.1 % EX CREA: 1.0000 | TOPICAL_CREAM | Freq: Two times a day (BID) | CUTANEOUS | 0 refills | Status: AC

## 2023-08-01 MED ORDER — VALACYCLOVIR HCL 1 G PO TABS: 1000.0000 mg | ORAL_TABLET | Freq: Three times a day (TID) | ORAL | 0 refills | Status: AC

## 2023-08-01 MED ORDER — SEMAGLUTIDE-WEIGHT MANAGEMENT 0.25 MG/0.5ML ~~LOC~~ SOAJ
0.2500 mg | SUBCUTANEOUS | 0 refills | Status: AC
Start: 2023-08-01 — End: 2023-08-29

## 2023-08-01 NOTE — Patient Instructions (Signed)
        Great to see you today.  I have refilled the medication(s) we provide.    - Please take medications as prescribed. - Follow up with your primary health provider if any health concerns arises. - If symptoms worsen please contact your primary care provider and/or visit the emergency department.  

## 2023-08-01 NOTE — Progress Notes (Unsigned)
Patient Office Visit   Subjective   Patient ID: Christian Simon, male    DOB: 1971-02-17  Age: 52 y.o. MRN: [id number]  CC:  Chief Complaint  Patient presents with   Rash    Patient complains of bilateral leg rash that spread to stomach starting 3 weeks ago.     HPI Christian Simon 52 year old male presents to the clinic for bilateral ankle rash and weight loss management. He  has a past medical history of Hyperlipidemia, Hypertension, and Left hydrocele.  Rash This is a new problem. The current episode started 1 to 4 weeks ago. The problem has been waxing and waning since onset. The affected locations include the left lower leg, right lower leg, abdomen, left upper leg and right upper leg. The rash is characterized by blistering, itchiness, peeling, redness, scaling and pain. He was exposed to nothing. Pertinent negatives include no congestion, diarrhea, fatigue or shortness of breath. Past treatments include nothing. His past medical history is significant for varicella. There is no history of allergies.      Outpatient Encounter Medications as of 08/01/2023  Medication Sig   amLODipine (NORVASC) 5 MG tablet Take 1 tablet (5 mg total) by mouth daily.   anastrozole (ARIMIDEX) 1 MG tablet Take 0.5 mg by mouth daily.   cyclobenzaprine (FLEXERIL) 10 MG tablet Take 0.5 tablets (5 mg total) by mouth 3 (three) times daily as needed for muscle spasms.   HYDROcodone-acetaminophen (NORCO) 5-325 MG tablet Take 1 tablet by mouth every 4 (four) hours as needed for moderate pain.   hydrOXYzine (VISTARIL) 25 MG capsule Take 1 capsule (25 mg total) by mouth every 8 (eight) hours as needed for itching.   Lido-Capsaicin-Men-Methyl Sal 0.5-0.035-5-20 % PTCH Apply 1 patch topically as needed.   lisinopril (ZESTRIL) 40 MG tablet Take 1 tablet (40 mg total) by mouth daily.   pantoprazole (PROTONIX) 40 MG tablet Take 1 tablet (40 mg total) by mouth daily.   predniSONE (DELTASONE) 20 MG tablet 3 tabs po daily x 3  days, then 2 tabs x 3 days, then 1.5 tabs x 3 days, then 1 tab x 3 days, then 0.5 tabs x 3 days   rosuvastatin (CRESTOR) 20 MG tablet Take 1 tablet (20 mg total) by mouth daily.   Semaglutide-Weight Management 0.25 MG/0.5ML SOAJ Inject 0.25 mg into the skin once a week for 28 days.   tadalafil (CIALIS) 20 MG tablet Take 0.5 tablets (10 mg total) by mouth daily.   testosterone cypionate (DEPOTESTOSTERONE CYPIONATE) 200 MG/ML injection Inject into the muscle every 14 (fourteen) days.   triamcinolone cream (KENALOG) 0.1 % Apply 1 Application topically 2 (two) times daily.   valACYclovir (VALTREX) 1000 MG tablet Take 1 tablet (1,000 mg total) by mouth 3 (three) times daily for 7 days.   No facility-administered encounter medications on file as of 08/01/2023.    Past Surgical History:  Procedure Laterality Date   APPENDECTOMY  age 10   HYDROCELE EXCISION Right 06/20/2017   Procedure: HYDROCELECTOMY ADULT;  Surgeon: Crista Elliot, MD;  Location: [facility];  Service: Urology;  Laterality: Right;    Review of Systems  Constitutional:  Negative for fatigue.  HENT:  Negative for congestion.   Respiratory:  Negative for shortness of breath.   Gastrointestinal:  Negative for diarrhea.  Skin:  Positive for rash.      Objective    BP 132/78   Pulse 94   Ht 6' (1.829 m)   [employer]  278 lb (126.1 kg)   SpO2 95%   BMI 37.70 kg/m   Physical Exam Vitals reviewed.  Constitutional:      General: He is not in acute distress.    Appearance: Normal appearance. He is not ill-appearing, toxic-appearing or diaphoretic.  HENT:     Head: Normocephalic.  Eyes:     General:        Right eye: No discharge.        Left eye: No discharge.     Conjunctiva/sclera: Conjunctivae normal.  Cardiovascular:     Rate and Rhythm: Normal rate.     Pulses: Normal pulses.     Heart sounds: Normal heart sounds.  Pulmonary:     Effort: Pulmonary effort is normal. No respiratory distress.     Breath  sounds: Normal breath sounds.  Musculoskeletal:        General: Normal range of motion.     Cervical back: Normal range of motion.  Skin:    General: Skin is warm and dry.     Capillary Refill: Capillary refill takes less than 2 seconds.     Comments: Painful Diffuse rash, Bilateral lower legs spread out through upper legs and abdominal area Erythematous base with grouped vesicular lesions. Vesicles are fluid-filled, some crusted over. Lesions follow a dermatomal distribution  Neurological:     General: No focal deficit present.     Mental Status: He is alert and oriented to person, place, and time.     Coordination: Coordination normal.     Gait: Gait normal.  Psychiatric:        Mood and Affect: Mood normal.        Behavior: Behavior normal.       Assessment & Plan:  Herpes zoster with complication Assessment & Plan: Valtrex 1000 mg x 7 days Hydroxyzine 25 mg , Kenalog ointment  Advise patient acetaminophen or ibuprofen can help.  Cool Compresses: Applying a cool, damp cloth to the affected areas can soothe itching and reduce pain. Calamine Lotion: This can help relieve itching and discomfort. Apply it to the rash as directed. Oatmeal Baths: Taking a lukewarm bath with colloidal oatmeal can help ease itching.  Orders: -     valACYclovir HCl; Take 1 tablet (1,000 mg total) by mouth 3 (three) times daily for 7 days.  Dispense: 21 tablet; Refill: 0  Obesity (BMI 30-39.9) Assessment & Plan: Trial on Wegovy Started patient on weight management plan Discussed the importance to start eating 3 meals a day including breakfast, drink 8 glasses of water a day ,reduce portion sizes. reduced carbohydrates limit saturated and trans fat, increase servings of vegetables and limit processed foods. Find an activity that you will enjoy and start to be active at least 5 days a week for 30 minutes each day. Keep a food journal or an activity journal to identify triggers that lead to emotional  eating Follow up in 4 weeks for Weight loss management    Orders: -     Semaglutide-Weight Management; Inject 0.25 mg into the skin once a week for 28 days.  Dispense: 2 mL; Refill: 0  Mixed hyperlipidemia  Other orders -     hydrOXYzine Pamoate; Take 1 capsule (25 mg total) by mouth every 8 (eight) hours as needed for itching.  Dispense: 30 capsule; Refill: 2 -     Triamcinolone Acetonide; Apply 1 Application topically 2 (two) times daily.  Dispense: 30 g; Refill: 0    Return in about 6 weeks (around  09/12/2023), or if symptoms worsen or fail to improve, for Weight Loss Mangment.   Cruzita Lederer Newman Nip, FNP

## 2023-08-02 ENCOUNTER — Encounter: Payer: Self-pay | Admitting: Family Medicine

## 2023-08-02 DIAGNOSIS — E669 Obesity, unspecified: Secondary | ICD-10-CM | POA: Insufficient documentation

## 2023-08-02 DIAGNOSIS — E785 Hyperlipidemia, unspecified: Secondary | ICD-10-CM | POA: Insufficient documentation

## 2023-08-02 DIAGNOSIS — B029 Zoster without complications: Secondary | ICD-10-CM | POA: Insufficient documentation

## 2023-08-02 NOTE — Assessment & Plan Note (Signed)
Trial on Wegovy Started patient on weight management plan Discussed the importance to start eating 3 meals a day including breakfast, drink 8 glasses of water a day ,reduce portion sizes. reduced carbohydrates limit saturated and trans fat, increase servings of vegetables and limit processed foods. Find an activity that you will enjoy and start to be active at least 5 days a week for 30 minutes each day. Keep a food journal or an activity journal to identify triggers that lead to emotional eating Follow up in 4 weeks for Weight loss management

## 2023-08-02 NOTE — Assessment & Plan Note (Signed)
Valtrex 1000 mg x 7 days Hydroxyzine 25 mg , Kenalog ointment  Advise patient acetaminophen or ibuprofen can help.  Cool Compresses: Applying a cool, damp cloth to the affected areas can soothe itching and reduce pain. Calamine Lotion: This can help relieve itching and discomfort. Apply it to the rash as directed. Oatmeal Baths: Taking a lukewarm bath with colloidal oatmeal can help ease itching.

## 2023-08-08 ENCOUNTER — Ambulatory Visit: Payer: BC Managed Care – PPO | Admitting: Family Medicine

## 2023-08-15 ENCOUNTER — Other Ambulatory Visit: Payer: Self-pay | Admitting: Family Medicine

## 2023-08-18 ENCOUNTER — Other Ambulatory Visit: Payer: Self-pay | Admitting: Family Medicine

## 2023-08-18 ENCOUNTER — Encounter: Payer: Self-pay | Admitting: Family Medicine

## 2023-08-22 ENCOUNTER — Encounter: Payer: Self-pay | Admitting: Family Medicine

## 2023-08-25 ENCOUNTER — Other Ambulatory Visit: Payer: Self-pay | Admitting: Family Medicine

## 2023-08-25 DIAGNOSIS — E538 Deficiency of other specified B group vitamins: Secondary | ICD-10-CM

## 2023-08-25 MED ORDER — HYDROCORTISONE 1 % EX CREA: 1.0000 | TOPICAL_CREAM | Freq: Two times a day (BID) | CUTANEOUS | 0 refills | Status: AC

## 2023-09-10 ENCOUNTER — Other Ambulatory Visit: Payer: Self-pay | Admitting: Family Medicine

## 2023-09-10 DIAGNOSIS — I10 Essential (primary) hypertension: Secondary | ICD-10-CM

## 2023-09-11 NOTE — Patient Instructions (Signed)

## 2023-09-11 NOTE — Progress Notes (Unsigned)
   Established Patient Office Visit   Subjective  Patient ID: Christian Simon, male    DOB: 1971-01-08  Age: 52 y.o. MRN: [id number]  No chief complaint on file.   He  has a past medical history of Hyperlipidemia, Hypertension, and Left hydrocele.  HPI  ROS    Objective:     There were no vitals taken for this visit. {Vitals History (Optional):23777}  Physical Exam   No results found for any visits on 09/12/23.  The 10-year ASCVD risk score (Arnett DK, et al., 2019) is: 6.2%    Assessment & Plan:  There are no diagnoses linked to this encounter.  No follow-ups on file.   Cruzita Lederer Newman Nip, FNP

## 2023-09-12 ENCOUNTER — Ambulatory Visit: Payer: BC Managed Care – PPO | Admitting: Family Medicine

## 2023-09-18 ENCOUNTER — Ambulatory Visit: Payer: BC Managed Care – PPO | Admitting: Endocrinology

## 2023-09-18 ENCOUNTER — Encounter: Payer: Self-pay | Admitting: Family Medicine

## 2023-10-09 ENCOUNTER — Other Ambulatory Visit: Payer: Self-pay | Admitting: Family Medicine

## 2023-10-09 DIAGNOSIS — I10 Essential (primary) hypertension: Secondary | ICD-10-CM

## 2023-11-10 ENCOUNTER — Other Ambulatory Visit: Payer: Self-pay | Admitting: Family Medicine

## 2023-11-10 DIAGNOSIS — I10 Essential (primary) hypertension: Secondary | ICD-10-CM

## 2023-12-08 ENCOUNTER — Other Ambulatory Visit: Payer: Self-pay | Admitting: Family Medicine

## 2023-12-08 DIAGNOSIS — I10 Essential (primary) hypertension: Secondary | ICD-10-CM

## 2024-01-06 ENCOUNTER — Other Ambulatory Visit: Payer: Self-pay | Admitting: Family Medicine

## 2024-01-06 DIAGNOSIS — I10 Essential (primary) hypertension: Secondary | ICD-10-CM

## 2024-03-30 ENCOUNTER — Encounter: Payer: Self-pay | Admitting: Family Medicine

## 2024-03-31 NOTE — Telephone Encounter (Signed)
 Patient needs office visit for repeat labs

## 2024-04-01 ENCOUNTER — Other Ambulatory Visit: Payer: Self-pay | Admitting: Family Medicine

## 2024-05-03 ENCOUNTER — Other Ambulatory Visit: Payer: Self-pay | Admitting: Family Medicine

## 2024-05-14 LAB — LAB REPORT - SCANNED: EGFR: 93

## 2024-05-21 ENCOUNTER — Ambulatory Visit: Admitting: Family Medicine

## 2024-05-21 ENCOUNTER — Encounter: Payer: Self-pay | Admitting: Family Medicine

## 2024-05-21 VITALS — BP 150/93 | HR 92 | Ht 72.0 in | Wt 271.0 lb

## 2024-05-21 DIAGNOSIS — I10 Essential (primary) hypertension: Secondary | ICD-10-CM | POA: Diagnosis not present

## 2024-05-21 DIAGNOSIS — R7989 Other specified abnormal findings of blood chemistry: Secondary | ICD-10-CM | POA: Diagnosis not present

## 2024-05-21 MED ORDER — LISINOPRIL 40 MG PO TABS: 40.0000 mg | ORAL_TABLET | Freq: Every day | ORAL | 1 refills | Status: AC

## 2024-05-21 MED ORDER — AMLODIPINE BESYLATE 10 MG PO TABS: 10.0000 mg | ORAL_TABLET | Freq: Every day | ORAL | 1 refills | Status: DC

## 2024-05-21 NOTE — Patient Instructions (Signed)

## 2024-05-21 NOTE — Assessment & Plan Note (Signed)
 Vitals:   05/21/24 1450 05/21/24 1500  BP: (!) 149/87 (!) 150/93  Not controlled Lisinopril  40 mg daily, increase Amlodipine  10 mg once daily Follow up in 2 weeks via my chart with blood pressure readings to monitor trends  Continued discussion on DASH diet, low sodium diet and maintain a exercise routine for 150 minutes per week.

## 2024-05-21 NOTE — Progress Notes (Signed)
   Established Patient Office Visit   Subjective  Patient ID: Christian Simon, male    DOB: 27-Sep-1971  Age: 53 y.o. MRN: [id number]  Chief Complaint  Patient presents with   ferritin    High ferritin levels    He  has a past medical history of Hyperlipidemia, Hypertension, and Left hydrocele.  HPI Patient presents to the clinic for hypertension follow up. For the details of today's visit, please refer to assessment and plan.   Review of Systems  Constitutional:  Negative for chills and fever.  Eyes:  Negative for blurred vision.  Respiratory:  Negative for shortness of breath.   Cardiovascular:  Negative for chest pain.  Neurological:  Negative for dizziness.      Objective:     BP (!) 150/93   Pulse 92   Ht 6' (1.829 m)   Wt 271 lb (122.9 kg)   SpO2 94%   BMI 36.75 kg/m  BP Readings from Last 3 Encounters:  05/21/24 (!) 150/93  08/01/23 132/78  04/04/23 (!) 150/84      Physical Exam Vitals reviewed.  Constitutional:      General: He is not in acute distress.    Appearance: Normal appearance. He is not ill-appearing, toxic-appearing or diaphoretic.  HENT:     Head: Normocephalic.  Eyes:     General:        Right eye: No discharge.        Left eye: No discharge.     Conjunctiva/sclera: Conjunctivae normal.  Cardiovascular:     Pulses: Normal pulses.     Heart sounds: Normal heart sounds.  Pulmonary:     Effort: Pulmonary effort is normal. No respiratory distress.     Breath sounds: Normal breath sounds.  Skin:    General: Skin is warm and dry.  Neurological:     Mental Status: He is alert.  Psychiatric:        Mood and Affect: Mood normal.        Behavior: Behavior normal.      No results found for any visits on 05/21/24.  The 10-year ASCVD risk score (Arnett DK, et al., 2019) is: 8.4%    Assessment & Plan:  High serum ferritin -     Iron, TIBC and Ferritin Panel  Primary hypertension Assessment & Plan: Vitals:   05/21/24 1450 05/21/24  1500  BP: (!) 149/87 (!) 150/93  Not controlled Lisinopril  40 mg daily, increase Amlodipine  10 mg once daily Follow up in 2 weeks via my chart with blood pressure readings to monitor trends  Continued discussion on DASH diet, low sodium diet and maintain a exercise routine for 150 minutes per week.   Orders: -     Lisinopril ; Take 1 tablet (40 mg total) by mouth daily.  Dispense: 90 tablet; Refill: 1 -     amLODIPine  Besylate; Take 1 tablet (10 mg total) by mouth daily.  Dispense: 90 tablet; Refill: 1    Return in about 4 months (around 09/21/2024), or if symptoms worsen or fail to improve, for hypertension, chronic follow-up.   Hilario Kidd Wilhelmena Falter, FNP

## 2024-06-02 ENCOUNTER — Other Ambulatory Visit: Payer: Self-pay | Admitting: Family Medicine

## 2024-06-03 ENCOUNTER — Telehealth: Payer: Self-pay | Admitting: Family Medicine

## 2024-06-03 DIAGNOSIS — R7989 Other specified abnormal findings of blood chemistry: Secondary | ICD-10-CM | POA: Diagnosis not present

## 2024-06-03 NOTE — Telephone Encounter (Signed)
 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

## 2024-06-03 NOTE — Telephone Encounter (Unsigned)
 Copied from CRM [id number]. 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

## 2024-06-04 LAB — IRON,TIBC AND FERRITIN PANEL
Ferritin: 522 ng/mL — ABNORMAL HIGH (ref 30–400)
Iron Saturation: 31 % (ref 15–55)
Iron: 86 ug/dL (ref 38–169)
Total Iron Binding Capacity: 281 ug/dL (ref 250–450)
UIBC: 195 ug/dL (ref 111–343)

## 2024-06-10 ENCOUNTER — Ambulatory Visit: Payer: Self-pay | Admitting: Family Medicine

## 2024-06-10 NOTE — Progress Notes (Signed)
 Please inform the patient that their ferritin is mildly elevated. There are no current concerns. The patient should limit red meat intake, and labs will be repeated at the next visit.   Foods High in Iron (can raise ferritin levels):  Red meat (beef, lamb, liver)  Organ meats  Dark poultry meat (e.g., [place], duck)  Shellfish (clams, oysters, mussels)  Fortified cereals and breads  Beans and lentils (non-heme iron, less readily absorbed)  Spinach and other dark leafy greens  Foods Lower in Iron (safer for mildly elevated ferritin):  White meats (chicken breast, fish)  Dairy products (milk, cheese, yogurt)  Eggs  Fruits and most vegetables (except iron-fortified or dark leafy greens in large amounts)  Whole grains in moderation

## 2024-06-11 ENCOUNTER — Encounter: Payer: Self-pay | Admitting: Family Medicine

## 2024-07-01 ENCOUNTER — Other Ambulatory Visit: Payer: Self-pay | Admitting: Family Medicine

## 2024-07-24 ENCOUNTER — Encounter (HOSPITAL_COMMUNITY): Payer: Self-pay

## 2024-07-24 ENCOUNTER — Emergency Department (HOSPITAL_COMMUNITY)

## 2024-07-24 ENCOUNTER — Other Ambulatory Visit: Payer: Self-pay

## 2024-07-24 ENCOUNTER — Emergency Department (HOSPITAL_COMMUNITY)
Admission: EM | Admit: 2024-07-24 | Discharge: 2024-07-24 | Disposition: A | Discharge status: Home / Self Care | Attending: Emergency Medicine | Admitting: Emergency Medicine

## 2024-07-24 DIAGNOSIS — Z79899 Other long term (current) drug therapy: Secondary | ICD-10-CM | POA: Diagnosis not present

## 2024-07-24 DIAGNOSIS — M47816 Spondylosis without myelopathy or radiculopathy, lumbar region: Secondary | ICD-10-CM | POA: Diagnosis not present

## 2024-07-24 DIAGNOSIS — M545 Low back pain, unspecified: Secondary | ICD-10-CM | POA: Diagnosis not present

## 2024-07-24 DIAGNOSIS — I10 Essential (primary) hypertension: Secondary | ICD-10-CM | POA: Diagnosis not present

## 2024-07-24 DIAGNOSIS — M549 Dorsalgia, unspecified: Secondary | ICD-10-CM | POA: Diagnosis not present

## 2024-07-24 MED ORDER — DIAZEPAM 2 MG PO TABS
2.0000 mg | ORAL_TABLET | Freq: Once | ORAL | Status: AC
  Administered 2024-07-24: 2 mg via ORAL
  Filled 2024-07-24: qty 1

## 2024-07-24 MED ORDER — DIAZEPAM 2 MG PO TABS: 2.0000 mg | ORAL_TABLET | Freq: Two times a day (BID) | ORAL | 0 refills | Status: AC

## 2024-07-24 MED ORDER — KETOROLAC TROMETHAMINE 15 MG/ML IJ SOLN
15.0000 mg | Freq: Once | INTRAMUSCULAR | Status: AC
  Administered 2024-07-24: 15 mg via INTRAMUSCULAR
  Filled 2024-07-24: qty 1

## 2024-07-24 NOTE — Discharge Instructions (Addendum)
 Is a pleasure to take care of you today.  You were seen for low back pain.  Your x-ray did not show any broken bones but did show degenerative changes.  You can take over-the-counter Tylenol  and Aleve as directed on packaging.  Also prescribed Valium which is a muscle relaxer to help with pain, do not drive or drink alcohol with this or take it with any medications that can cause drowsiness.  Can continue using the over-the-counter. lidocaine  patches.  Follow-up closely with PCP and/or spine doctor.  Come back to the ER for new or worsening symptoms.

## 2024-07-24 NOTE — ED Provider Notes (Signed)
 [place] [facility]PARTMENT AT [facility] Provider Note   CSN: [id number] Arrival date & time: 07/24/24  1210     Patient presents with: Back Pain   Christian Simon is a 53 y.o. male.    Back Pain      Prior to Admission medications   Medication Sig Start Date End Date Taking? Authorizing Provider  diazepam (VALIUM) 2 MG tablet Take 1 tablet (2 mg total) by mouth 2 (two) times daily. 07/24/24  Yes Mehar Sagen A, PA-C  amLODipine  (NORVASC ) 10 MG tablet Take 1 tablet (10 mg total) by mouth daily. 05/21/24   Del Orbe Polanco, Iliana, FNP  anastrozole (ARIMIDEX) 1 MG tablet Take 0.5 mg by mouth daily.    [provider]  cyclobenzaprine  (FLEXERIL ) 10 MG tablet Take 0.5 tablets (5 mg total) by mouth 3 (three) times daily as needed for muscle spasms. 12/21/19   Mesner, Selinda, MD  HYDROcodone -acetaminophen  (NORCO) 5-325 MG tablet Take 1 tablet by mouth every 4 (four) hours as needed for moderate pain. 06/20/17   Carolee Sherwood JONETTA DOUGLAS, MD  hydrocortisone  cream 1 % Apply 1 Application topically 2 (two) times daily. 08/25/23   Del Wilhelmena Lloyd Sola, FNP  hydrOXYzine  (VISTARIL ) 25 MG capsule Take 1 capsule (25 mg total) by mouth every 8 (eight) hours as needed for itching. 08/01/23   Del Orbe Polanco, Iliana, FNP  Lido-Capsaicin -Men-Methyl Sal 0.5-0.035-5-20 % PTCH Apply 1 patch topically as needed. 12/21/19   Mesner, Selinda, MD  lisinopril  (ZESTRIL ) 40 MG tablet Take 1 tablet (40 mg total) by mouth daily. 05/21/24   Del Orbe Polanco, Iliana, FNP  pantoprazole  (PROTONIX ) 40 MG tablet Take 1 tablet by mouth once daily 07/01/24   Del Orbe Polanco, Iliana, FNP  predniSONE  (DELTASONE ) 20 MG tablet 3 tabs po daily x 3 days, then 2 tabs x 3 days, then 1.5 tabs x 3 days, then 1 tab x 3 days, then 0.5 tabs x 3 days 12/21/19   Mesner, Selinda, MD  rosuvastatin  (CRESTOR ) 20 MG tablet Take 1 tablet by mouth once daily 07/01/24   Del Orbe Polanco, Iliana, FNP  tadalafil  (CIALIS ) 20 MG tablet  Take 1/2 (one-half) tablet by mouth once daily 08/18/23   Del Orbe Polanco, Sola, FNP  testosterone cypionate (DEPOTESTOSTERONE CYPIONATE) 200 MG/ML injection Inject into the muscle every 14 (fourteen) days.    [provider]  triamcinolone  cream (KENALOG ) 0.1 % Apply 1 Application topically 2 (two) times daily. 08/01/23   Del Orbe Polanco, Iliana, FNP    Allergies: Penicillins    Review of Systems  Musculoskeletal:  Positive for back pain.    Updated Vital Signs BP 133/88   Pulse 78   Temp 98 F (36.7 C) (Oral)   Resp 17   Ht 6' (1.829 m)   Wt 117.9 kg   SpO2 94%   BMI 35.26 kg/m   Physical Exam  (all labs ordered are listed, but only abnormal results are displayed) Labs Reviewed - No data to display  EKG: None  Radiology: DG Lumbar Spine Complete Result Date: 07/24/2024 CLINICAL DATA:  Lower back pain for 1 week, left-sided swelling EXAM: LUMBAR SPINE - COMPLETE 4+ VIEW COMPARISON:  None Available. FINDINGS: Frontal, bilateral oblique, lateral views of the lumbar spine are obtained. There are 5 non-rib-bearing lumbar type vertebral bodies in grossly normal anatomic alignment. No acute displaced fracture. Mild multilevel spondylosis and facet hypertrophy, greatest at L4-5 and L5-S1. Sacroiliac joints are normal. IMPRESSION: 1. Lower lumbar spondylosis and  facet hypertrophy. 2. No acute bony abnormality. Electronically Signed   By: Ozell Daring M.D.   On: 07/24/2024 12:44     Procedures   Medications Ordered in the ED  ketorolac  (TORADOL ) 15 MG/ML injection 15 mg (15 mg Intramuscular Given 07/24/24 1435)  diazepam (VALIUM) tablet 2 mg (2 mg Oral Given 07/24/24 1436)                                    Medical Decision Making This patient presents to the ED for concern of back pain this involves an extensive number of treatment options, and is a complaint that carries with it a high risk of complications and morbidity.  The differential diagnosis includes  sprain, strain, HNP, fracture, DDD, muscle spasm, cauda equina, epidural abscess or hematoma, malignancy, other   Co morbidities that complicate the patient evaluation  HTN, high cholesterol   Additional history obtained:  Additional history obtained from EMR External records from outside source obtained and reviewed including previous notes     Imaging Studies ordered:  I ordered imaging studies including x-ray lumbar spine I independently visualized and interpreted imaging which showed no fracture or traumatic malalignment, lower lumbar facet hypertrophy and spondylosis I agree with the radiologist interpretation      Problem List / ED Course / Critical interventions / Medication management  Patient presents with low back pain bilaterally with palpable spasm on exam. Differential considered as above. Symptoms are likely due to the spine from. They have no high risk features including saddle anesthesia/paresthesia, bowel or bladder incontinence, urinary retention, fever, weight loss, history of cancer or immune suppression, or IV drug use. We discussed plan to treat symptoms with Toradol  and Valium as he had palpable spasm in his bilateral lumbar paraspinous muscles left greater than right, and follow up with CP and/or spine. They were advised on strict return precautions.  I ordered medication including Toradol  and Valium for pain and muscle spasm Reevaluation of the patient after these medicines showed that the patient improved I have reviewed the patients home medicines and have made adjustments as needed      Amount and/or Complexity of Data Reviewed Radiology: ordered.  Risk Prescription drug management.        Final diagnoses:  Acute bilateral low back pain without sciatica    ED Discharge Orders          Ordered    diazepam (VALIUM) 2 MG tablet  2 times daily        07/24/24 [address] A, PA-C 07/24/24 1634     Franklyn Sid SAILOR, MD 07/25/24 [phone]

## 2024-07-24 NOTE — ED Triage Notes (Signed)
 Pt complaining of lower back pain that started a week ago. Pt stated that he has taken OTC meds with no relief.

## 2024-07-25 ENCOUNTER — Emergency Department (HOSPITAL_COMMUNITY)

## 2024-07-25 ENCOUNTER — Other Ambulatory Visit: Payer: Self-pay

## 2024-07-25 ENCOUNTER — Emergency Department (HOSPITAL_COMMUNITY)
Admission: EM | Admit: 2024-07-25 | Discharge: 2024-07-25 | Disposition: A | Discharge status: Home / Self Care | Attending: Emergency Medicine | Admitting: Emergency Medicine

## 2024-07-25 DIAGNOSIS — M5136 Other intervertebral disc degeneration, lumbar region with discogenic back pain only: Secondary | ICD-10-CM | POA: Diagnosis not present

## 2024-07-25 DIAGNOSIS — M5137 Other intervertebral disc degeneration, lumbosacral region with discogenic back pain only: Secondary | ICD-10-CM | POA: Diagnosis not present

## 2024-07-25 DIAGNOSIS — M545 Low back pain, unspecified: Secondary | ICD-10-CM | POA: Insufficient documentation

## 2024-07-25 DIAGNOSIS — I10 Essential (primary) hypertension: Secondary | ICD-10-CM | POA: Diagnosis not present

## 2024-07-25 DIAGNOSIS — R109 Unspecified abdominal pain: Secondary | ICD-10-CM | POA: Diagnosis not present

## 2024-07-25 DIAGNOSIS — Z79899 Other long term (current) drug therapy: Secondary | ICD-10-CM | POA: Diagnosis not present

## 2024-07-25 LAB — CBC WITH DIFFERENTIAL/PLATELET
Abs Immature Granulocytes: 0.09 K/uL — ABNORMAL HIGH (ref 0.00–0.07)
Basophils Absolute: 0.1 K/uL (ref 0.0–0.1)
Basophils Relative: 1 %
Eosinophils Absolute: 0.1 K/uL (ref 0.0–0.5)
Eosinophils Relative: 2 %
HCT: 48.9 % (ref 39.0–52.0)
Hemoglobin: 16.6 g/dL (ref 13.0–17.0)
Immature Granulocytes: 1 %
Lymphocytes Relative: 34 %
Lymphs Abs: 2.2 K/uL (ref 0.7–4.0)
MCH: 29.6 pg (ref 26.0–34.0)
MCHC: 33.9 g/dL (ref 30.0–36.0)
MCV: 87.2 fL (ref 80.0–100.0)
Monocytes Absolute: 0.4 K/uL (ref 0.1–1.0)
Monocytes Relative: 6 %
Neutro Abs: 3.6 K/uL (ref 1.7–7.7)
Neutrophils Relative %: 56 %
Platelets: 235 K/uL (ref 150–400)
RBC: 5.61 MIL/uL (ref 4.22–5.81)
RDW: 12.2 % (ref 11.5–15.5)
WBC: 6.5 K/uL (ref 4.0–10.5)
nRBC: 0 % (ref 0.0–0.2)

## 2024-07-25 LAB — URINALYSIS, W/ REFLEX TO CULTURE (INFECTION SUSPECTED)
Bacteria, UA: NONE SEEN
Bilirubin Urine: NEGATIVE
Glucose, UA: NEGATIVE mg/dL
Hgb urine dipstick: NEGATIVE
Ketones, ur: NEGATIVE mg/dL
Leukocytes,Ua: NEGATIVE
Nitrite: NEGATIVE
Protein, ur: NEGATIVE mg/dL
Specific Gravity, Urine: 1.021 (ref 1.005–1.030)
pH: 5 (ref 5.0–8.0)

## 2024-07-25 LAB — BASIC METABOLIC PANEL WITH GFR
Anion gap: 8 (ref 5–15)
BUN: 14 mg/dL (ref 6–20)
CO2: 30 mmol/L (ref 22–32)
Calcium: 9.5 mg/dL (ref 8.9–10.3)
Chloride: 102 mmol/L (ref 98–111)
Creatinine, Ser: 1.02 mg/dL (ref 0.61–1.24)
GFR, Estimated: >60 mL/min (ref >60)
Glucose, Bld: 103 mg/dL — ABNORMAL HIGH (ref 70–99)
Potassium: 5 mmol/L (ref 3.5–5.1)
Sodium: 140 mmol/L (ref 135–145)

## 2024-07-25 MED ORDER — MORPHINE SULFATE (PF) 4 MG/ML IV SOLN
4.0000 mg | Freq: Once | INTRAVENOUS | Status: AC
  Administered 2024-07-25: 4 mg via INTRAVENOUS
  Filled 2024-07-25: qty 1

## 2024-07-25 MED ORDER — LIDOCAINE 5 % EX PTCH: 1.0000 | MEDICATED_PATCH | CUTANEOUS | 0 refills | Status: AC

## 2024-07-25 MED ORDER — LIDOCAINE 5 % EX PTCH
1.0000 | MEDICATED_PATCH | CUTANEOUS | Status: DC
  Administered 2024-07-25: 1 via TRANSDERMAL
  Filled 2024-07-25: qty 1

## 2024-07-25 MED ORDER — IOHEXOL 350 MG/ML SOLN: 75.0000 mL | Freq: Once | INTRAVENOUS | Status: DC | PRN

## 2024-07-25 MED ORDER — MORPHINE SULFATE (PF) 4 MG/ML IV SOLN
4.0000 mg | Freq: Once | INTRAVENOUS | Status: AC
  Administered 2024-07-25: 4 mg via INTRAMUSCULAR
  Filled 2024-07-25: qty 1

## 2024-07-25 MED ORDER — KETOROLAC TROMETHAMINE 30 MG/ML IJ SOLN
30.0000 mg | Freq: Once | INTRAMUSCULAR | Status: AC
Start: 2024-07-25 — End: 2024-07-25
  Administered 2024-07-25: 30 mg via INTRAMUSCULAR
  Filled 2024-07-25: qty 1

## 2024-07-25 MED ORDER — DIAZEPAM 5 MG/ML IJ SOLN
2.5000 mg | Freq: Once | INTRAMUSCULAR | Status: AC
  Administered 2024-07-25: 2.5 mg via INTRAVENOUS
  Filled 2024-07-25: qty 2

## 2024-07-25 MED ORDER — ACETAMINOPHEN 500 MG PO TABS
1000.0000 mg | ORAL_TABLET | Freq: Once | ORAL | Status: AC
  Administered 2024-07-25: 1000 mg via ORAL
  Filled 2024-07-25: qty 2

## 2024-07-25 MED ORDER — METHOCARBAMOL 500 MG PO TABS: 500.0000 mg | ORAL_TABLET | Freq: Two times a day (BID) | ORAL | 0 refills | Status: AC

## 2024-07-25 MED ORDER — METHOCARBAMOL 500 MG PO TABS
500.0000 mg | ORAL_TABLET | Freq: Once | ORAL | Status: AC
  Administered 2024-07-25: 500 mg via ORAL
  Filled 2024-07-25: qty 1

## 2024-07-25 NOTE — ED Triage Notes (Signed)
 Pt arrives POV complaining of lower back pain that started a week ago. Seen for same yesterday but has had no relief.

## 2024-07-25 NOTE — ED Provider Notes (Signed)
[place] EMERGENCY DEPARTMENT AT [facility] Provider Note   CSN: [id number] Arrival date & time: 07/25/24  9475     History  Chief Complaint  Patient presents with   Back Pain    Christian Simon is a 53 y.o. male with PMH as listed below who presents with lower back pain that started a week ago.  Pain is in the middle of the lower back and off to the left side into his left buttock and radiates down the front of his left leg.  Not necessarily any inciting factor or falls. Seen for same yesterday but has had no relief.  Was seen here yesterday and received Toradol  and oral diazepam which helped him some but did not take the pain away.   He had a lumbar spine x-ray yesterday that did not demonstrate any acute findings. He got home and the Aleve and Valium were not enough.  On arrival his pain is 10 out of 10. No urinary symptoms, fever/chills, falls/trauma, history of surgery, history of cancer, history of IVDU.  Denies urinary incontinence or fecal incontinence.  Denies saddle anesthesia.  Denies urinary retention or acute constipation.  Does endorse pain in the center of the spine.   Past Medical History:  Diagnosis Date   Hyperlipidemia    Hypertension    Left hydrocele        Home Medications Prior to Admission medications   Medication Sig Start Date End Date Taking? Authorizing Provider  amLODipine  (NORVASC ) 10 MG tablet Take 1 tablet (10 mg total) by mouth daily. 05/21/24   Del Orbe Polanco, Iliana, FNP  anastrozole (ARIMIDEX) 1 MG tablet Take 0.5 mg by mouth daily.    [provider]  cyclobenzaprine  (FLEXERIL ) 10 MG tablet Take 0.5 tablets (5 mg total) by mouth 3 (three) times daily as needed for muscle spasms. 12/21/19   Mesner, Jason, MD  diazepam (VALIUM) 2 MG tablet Take 1 tablet (2 mg total) by mouth 2 (two) times daily. 07/24/24   Suellen Cantor A, PA-C  HYDROcodone -acetaminophen  (NORCO) 5-325 MG tablet Take 1 tablet by mouth every 4 (four) hours as  needed for moderate pain. 06/20/17   Carolee Sherwood BIRCH III, MD  hydrocortisone  cream 1 % Apply 1 Application topically 2 (two) times daily. 08/25/23   Del Wilhelmena Lloyd Sola, FNP  hydrOXYzine  (VISTARIL ) 25 MG capsule Take 1 capsule (25 mg total) by mouth every 8 (eight) hours as needed for itching. 08/01/23   Del Orbe Polanco, Iliana, FNP  Lido-Capsaicin -Men-Methyl Sal 0.5-0.035-5-20 % PTCH Apply 1 patch topically as needed. 12/21/19   Mesner, Selinda, MD  lisinopril  (ZESTRIL ) 40 MG tablet Take 1 tablet (40 mg total) by mouth daily. 05/21/24   Del Orbe Polanco, Iliana, FNP  pantoprazole  (PROTONIX ) 40 MG tablet Take 1 tablet by mouth once daily 07/01/24   Del Orbe Polanco, Iliana, FNP  predniSONE  (DELTASONE ) 20 MG tablet 3 tabs po daily x 3 days, then 2 tabs x 3 days, then 1.5 tabs x 3 days, then 1 tab x 3 days, then 0.5 tabs x 3 days 12/21/19   Mesner, Selinda, MD  rosuvastatin  (CRESTOR ) 20 MG tablet Take 1 tablet by mouth once daily 07/01/24   Del Orbe Polanco, Iliana, FNP  tadalafil  (CIALIS ) 20 MG tablet Take 1/2 (one-half) tablet by mouth once daily 08/18/23   Del Orbe Polanco, Sola, FNP  testosterone cypionate (DEPOTESTOSTERONE CYPIONATE) 200 MG/ML injection Inject into the muscle every 14 (fourteen) days.    [provider]  triamcinolone  cream (KENALOG ) 0.1 % Apply 1 Application topically 2 (two) times daily. 08/01/23   Del Wilhelmena Lloyd Sola, FNP      Allergies    Penicillins    Review of Systems   Review of Systems A 10 point review of systems was performed and is negative unless otherwise reported in HPI.  Physical Exam Updated Vital Signs BP 127/72   Pulse 83   Temp 97.9 F (36.6 C) (Oral)   Resp 18   SpO2 97%  Physical Exam General: Normal appearing male, lying in bed.  HEENT: PERRLA, Sclera anicteric, MMM, trachea midline.  Cardiology: RRR, no murmurs/rubs/gallops. BL radial and DP pulses equal bilaterally.  Resp: Normal respiratory rate and effort. CTAB, no wheezes,  rhonchi, crackles.  Abd: Soft, non-tender, non-distended. No rebound tenderness or guarding.  GU: Deferred. MSK: No peripheral edema or signs of trauma. Extremities without deformity or TTP. No cyanosis or clubbing. Skin: warm, dry. No rashes or lesions. Back: + Midline lower lumbar tenderness to palpation.  Tender ovation in the left lower lumbar and sacral paraspinal muscles including the left buttock.  No CVA tenderness. Neuro: A&Ox4, CNs II-XII grossly intact. MAEs. Sensation grossly intact.  Psych: Normal mood and affect.   ED Results / Procedures / Treatments   Labs (all labs ordered are listed, but only abnormal results are displayed) Labs Reviewed - No data to display  EKG None  Radiology DG Lumbar Spine Complete Result Date: 07/24/2024 CLINICAL DATA:  Lower back pain for 1 week, left-sided swelling EXAM: LUMBAR SPINE - COMPLETE 4+ VIEW COMPARISON:  None Available. FINDINGS: Frontal, bilateral oblique, lateral views of the lumbar spine are obtained. There are 5 non-rib-bearing lumbar type vertebral bodies in grossly normal anatomic alignment. No acute displaced fracture. Mild multilevel spondylosis and facet hypertrophy, greatest at L4-5 and L5-S1. Sacroiliac joints are normal. IMPRESSION: 1. Lower lumbar spondylosis and facet hypertrophy. 2. No acute bony abnormality. Electronically Signed   By: Ozell Daring M.D.   On: 07/24/2024 12:44    Procedures Procedures    Medications Ordered in ED Medications  ketorolac  (TORADOL ) 30 MG/ML injection 30 mg (30 mg Intramuscular Given 07/25/24 0630)  morphine (PF) 4 MG/ML injection 4 mg (4 mg Intramuscular Given 07/25/24 0631)    ED Course/ Medical Decision Making/ A&P                          Medical Decision Making Amount and/or Complexity of Data Reviewed Labs: ordered. Decision-making details documented in ED Course. Radiology: ordered. Decision-making details documented in ED Course.  Risk OTC drugs. Prescription drug  management.    This patient presents to the ED for concern of lower back pain, this involves an extensive number of treatment options, and is a complaint that carries with it a high risk of complications and morbidity.  I considered the following differential and admission for this acute, potentially life threatening condition.   MDM:    DDX for low back pain includes but is not limited to:   Consider MSK pain as presenting etiology and back strain or sciatica. Less likely sciatica as straight leg raise test was negative.  He has reassuring history, presentation not consistent with malignancy (lack of history of malignancy, lack of B symptoms), cauda equina (no bowel or urinary incontinence/retention, no saddle anesthesia, no distal weakness), AAA, viscus perforation, osteomyelitis or epidural abscess (no IVDU, vertebral tenderness), renal colic, pyelonephritis (afebrile, no CVAT, no urinary symptoms).  However patient  does have tenderness in the midline.  A lumbar spine x-ray that was reassuring but we will obtain a CT scan today.  He received Toradol  and morphine IM already prior to my arrival and will treat with Tylenol , Valium IV, lidocaine  patch, Robaxin , and heat.   Clinical Course as of 08/03/24 2351  Sun Jul 25, 2024  9140 CT Lumbar Spine Wo Contrast 1. No acute osseous abnormality in the Lumbar Spine. 2. Lumbar spine degeneration with no significant stenosis by CT. Disc degeneration at L5-S1 might be asymmetric to the left, query left-side radiculitis.   [HN]  1227 Urinalysis, w/ Reflex to Culture (Infection Suspected) -Urine, Clean Catch neg [HN]  1231 Patient with severe intermittent pain. No RBCs or hematuria but also consider renal stone. Will perform CT renal stone study to rule out and give additional pain medicine. Patient pacing around the room and in too much pain to sit down.  [HN]  1340 CT Renal Stone Study 1. No acute findings in the abdomen pelvis. 2. No  nephrolithiasis or ureterolithiasis. 3. Appendix not identified but no secondary signs of appendicitis.   [HN]  1351 Patient reevaluated. Feeling somewhat better. Neg CT lumbar spine, Neg CT renal stone study. Neg UA, reassuring labs. Likley lumbar strain/sprain or lumbar muscle spasm. Offered patient admission to hospital for pain control but he declines. States diazepam PO wasn't working at home, will try robaxin  PO at home, instructed not to take it with diazepam or to drive while taking it. Instructed to alternate tylenol /ibuprofen at home and to use heat/massage/stretching to help as well. Instructed to f/u with PCP or spine doctor and to consider physical therapy. DC w/ discharge instructions/return precautions. All questions answered to patient's satisfaction.   [HN]    Clinical Course User Index [HN] Franklyn Sid SAILOR, MD    Labs: I Ordered, and personally interpreted labs.  The pertinent results include: Those listed above  Imaging Stdies ordered: I ordered imaging studies including CT lumbar spine without contrast I independently visualized and interpreted imaging. I agree with the radiologist interpretation  Additional history obtained from chart review.   Reevaluation: After the interventions noted above, I reevaluated the patient and found that they have :improved  Social Determinants of Health:  lives independently  Disposition:  DC  Co morbidities that complicate the patient evaluation  Past Medical History:  Diagnosis Date   Hyperlipidemia    Hypertension    Left hydrocele      Medicines Meds ordered this encounter  Medications   ketorolac  (TORADOL ) 30 MG/ML injection 30 mg   morphine (PF) 4 MG/ML injection 4 mg    I have reviewed the patients home medicines and have made adjustments as needed  Problem List / ED Course: Problem List Items Addressed This Visit   None Visit Diagnoses       Acute left-sided low back pain, unspecified whether sciatica  present    -  Primary   Relevant Medications   ketorolac  (TORADOL ) 30 MG/ML injection 30 mg (Completed)   morphine (PF) 4 MG/ML injection 4 mg (Completed)   methocarbamol  (ROBAXIN ) tablet 500 mg (Completed)   acetaminophen  (TYLENOL ) tablet 1,000 mg (Completed)   morphine (PF) 4 MG/ML injection 4 mg (Completed)   methocarbamol  (ROBAXIN ) 500 MG tablet                   This note was created using dictation software, which may contain spelling or grammatical errors.    Franklyn Sid SAILOR, MD 08/03/24  2351  

## 2024-07-25 NOTE — Discharge Instructions (Addendum)
 Thank you for coming to [facility] Emergency Department. You were seen for low back pain. Your CT lumbar spine and CT renal stone study were negative. Your urine and labs were reassuring.  Please take tylenol  1,000 mg every 8 hours and ibuprofen 600 mg every 8 hours. You can alternate them. You can take robaxin  500 mg every 12 hours for muscle spasm. Please do not take it with diazepam. Please do not drive while taking this medication. Please utilize heat, massage, or stretching. You can also use lidocaine  patch once per day.   Please follow up with your primary care provider or a spine doctor within 1-2 weeks if symptoms have not improved. You can consider physical therapy or more advanced imaging such as MRI.   Do not hesitate to return to the ED or call 911 if you experience: -Worsening symptoms -Lightheadedness, passing out -Fevers/chills -Anything else that concerns you

## 2024-07-29 ENCOUNTER — Other Ambulatory Visit: Payer: Self-pay | Admitting: Family Medicine

## 2024-07-29 DIAGNOSIS — M5416 Radiculopathy, lumbar region: Secondary | ICD-10-CM | POA: Diagnosis not present

## 2024-07-29 DIAGNOSIS — M5126 Other intervertebral disc displacement, lumbar region: Secondary | ICD-10-CM | POA: Diagnosis not present

## 2024-08-31 ENCOUNTER — Other Ambulatory Visit (HOSPITAL_COMMUNITY): Payer: Self-pay | Admitting: Neurosurgery

## 2024-08-31 DIAGNOSIS — M5126 Other intervertebral disc displacement, lumbar region: Secondary | ICD-10-CM

## 2024-09-10 ENCOUNTER — Ambulatory Visit (HOSPITAL_COMMUNITY)
Admission: RE | Admit: 2024-09-10 | Discharge: 2024-09-10 | Disposition: A | Discharge status: Home / Self Care | Source: Ambulatory Visit | Attending: Neurosurgery | Admitting: Neurosurgery

## 2024-09-10 DIAGNOSIS — M48061 Spinal stenosis, lumbar region without neurogenic claudication: Secondary | ICD-10-CM | POA: Diagnosis not present

## 2024-09-10 DIAGNOSIS — M5126 Other intervertebral disc displacement, lumbar region: Secondary | ICD-10-CM | POA: Insufficient documentation

## 2024-09-27 ENCOUNTER — Ambulatory Visit: Admitting: Family Medicine

## 2024-10-12 DIAGNOSIS — M5416 Radiculopathy, lumbar region: Secondary | ICD-10-CM | POA: Diagnosis not present

## 2024-10-12 DIAGNOSIS — M5126 Other intervertebral disc displacement, lumbar region: Secondary | ICD-10-CM | POA: Diagnosis not present

## 2024-10-12 DIAGNOSIS — Z6835 Body mass index (BMI) 35.0-35.9, adult: Secondary | ICD-10-CM | POA: Diagnosis not present

## 2024-11-10 ENCOUNTER — Other Ambulatory Visit: Payer: Self-pay | Admitting: Family Medicine

## 2024-11-10 DIAGNOSIS — I10 Essential (primary) hypertension: Secondary | ICD-10-CM
